# Patient Record
Sex: Female | Born: 1935 | Race: White | Hispanic: No | Smoking: Former smoker
Health system: Southern US, Community
[De-identification: ages and names within clinical notes are randomized; demographics above are authoritative.]

## PROBLEM LIST (undated history)

## (undated) DIAGNOSIS — M1990 Unspecified osteoarthritis, unspecified site: Secondary | ICD-10-CM

## (undated) DIAGNOSIS — I10 Essential (primary) hypertension: Secondary | ICD-10-CM

## (undated) HISTORY — PX: EYE SURGERY: SHX253

## (undated) HISTORY — PX: APPENDECTOMY: SHX54

## (undated) HISTORY — PX: WRIST ARTHROPLASTY: SHX1088

---

## 2013-12-01 ENCOUNTER — Other Ambulatory Visit: Payer: Self-pay | Admitting: Orthopedic Surgery

## 2013-12-05 ENCOUNTER — Encounter (HOSPITAL_COMMUNITY): Payer: Self-pay | Admitting: Pharmacy Technician

## 2013-12-09 ENCOUNTER — Other Ambulatory Visit (HOSPITAL_COMMUNITY): Payer: Self-pay | Admitting: *Deleted

## 2013-12-09 NOTE — Pre-Procedure Instructions (Signed)
Natasha Ford  12/09/2013   Your procedure is scheduled on:  Friday, December 19, 2013 at 7:30 AM.   [facility] Admitting Office at 5:30 AM.   Call this number if you have problems the morning of surgery: [phone]   Remember:   Do not eat food or drink liquids after midnight Thursday, 12/18/13.   Take these medicines the morning of surgery with A SIP OF WATER: None  Stop all Vitamins and Mobic (Meloxicam) as of Friday, 12/12/13. Do not take any Aspirin products or NSAIDS (Ibuprofen, Aleve, etc.) prior to surgery   Do not wear jewelry, make-up or nail polish.  Do not wear lotions, powders, or perfumes. You may wear deodorant.  Do not shave 48 hours prior to surgery.   Do not bring valuables to the hospital.  [facility] is not responsible                  for any belongings or valuables.               Contacts, dentures or bridgework may not be worn into surgery.  Leave suitcase in the car. After surgery it may be brought to your room.  For patients admitted to the hospital, discharge time is determined by your                treatment team.              Special Instructions: [place] - Preparing for Surgery  Before surgery, you can play an important role.  Because skin is not sterile, your skin needs to be as free of germs as possible.  You can reduce the number of germs on you skin by washing with CHG (chlorahexidine gluconate) soap before surgery.  CHG is an antiseptic cleaner which kills germs and bonds with the skin to continue killing germs even after washing.  Please DO NOT use if you have an allergy to CHG or antibacterial soaps.  If your skin becomes reddened/irritated stop using the CHG and inform your nurse when you arrive at Short Stay.  Do not shave (including legs and underarms) for at least 48 hours prior to the first CHG shower.  You may shave your face.  Please follow these instructions carefully:   1.  Shower with CHG Soap the  night before surgery and the                                morning of Surgery.  2.  If you choose to wash your hair, wash your hair first as usual with your       normal shampoo.  3.  After you shampoo, rinse your hair and body thoroughly to remove the                      Shampoo.  4.  Use CHG as you would any other liquid soap.  You can apply chg directly       to the skin and wash gently with scrungie or a clean washcloth.  5.  Apply the CHG Soap to your body ONLY FROM THE NECK DOWN.        Do not use on open wounds or open sores.  Avoid contact with your eyes, ears, mouth and genitals (private parts).  Wash genitals (private parts) with your normal soap.  6.  Wash thoroughly, paying  special attention to the area where your surgery        will be performed.  7.  Thoroughly rinse your body with warm water from the neck down.  8.  DO NOT shower/wash with your normal soap after using and rinsing off       the CHG Soap.  9.  Pat yourself dry with a clean towel.            10.  Wear clean pajamas.            11.  Place clean sheets on your bed the night of your first shower and do not        sleep with pets.  Day of Surgery  Do not apply any lotions the morning of surgery.  Please wear clean clothes to the hospital/surgery center.     Please read over the following fact sheets that you were given: Pain Booklet, Coughing and Deep Breathing, Blood Transfusion Information, MRSA Information and Surgical Site Infection Prevention

## 2013-12-10 ENCOUNTER — Encounter (HOSPITAL_COMMUNITY): Payer: Self-pay

## 2013-12-10 ENCOUNTER — Encounter (HOSPITAL_COMMUNITY)
Admission: RE | Admit: 2013-12-10 | Discharge: 2013-12-10 | Disposition: A | Discharge status: Home / Self Care | Payer: No Typology Code available for payment source | Source: Ambulatory Visit | Attending: Orthopedic Surgery | Admitting: Orthopedic Surgery

## 2013-12-10 DIAGNOSIS — Z01810 Encounter for preprocedural cardiovascular examination: Secondary | ICD-10-CM | POA: Insufficient documentation

## 2013-12-10 DIAGNOSIS — Z01812 Encounter for preprocedural laboratory examination: Secondary | ICD-10-CM | POA: Insufficient documentation

## 2013-12-10 HISTORY — DX: Essential (primary) hypertension: I10

## 2013-12-10 HISTORY — DX: Unspecified osteoarthritis, unspecified site: M19.90

## 2013-12-10 LAB — COMPREHENSIVE METABOLIC PANEL
ALT: 13 U/L (ref 0–35)
AST: 19 U/L (ref 0–37)
Albumin: 3.7 g/dL (ref 3.5–5.2)
Alkaline Phosphatase: 89 U/L (ref 39–117)
Anion gap: 15 (ref 5–15)
BUN: 17 mg/dL (ref 6–23)
CALCIUM: 9.7 mg/dL (ref 8.4–10.5)
CO2: 24 mEq/L (ref 19–32)
CREATININE: 0.67 mg/dL (ref 0.50–1.10)
Chloride: 103 mEq/L (ref 96–112)
GFR calc Af Amer: >90 mL/min (ref >90)
GFR calc non Af Amer: 79 mL/min — ABNORMAL LOW (ref >90)
GLUCOSE: 98 mg/dL (ref 70–99)
Potassium: 4.1 mEq/L (ref 3.7–5.3)
SODIUM: 142 meq/L (ref 137–147)
TOTAL PROTEIN: 7.2 g/dL (ref 6.0–8.3)
Total Bilirubin: 0.5 mg/dL (ref 0.3–1.2)

## 2013-12-10 LAB — URINALYSIS, ROUTINE W REFLEX MICROSCOPIC
BILIRUBIN URINE: NEGATIVE
Glucose, UA: NEGATIVE mg/dL
HGB URINE DIPSTICK: NEGATIVE
Ketones, ur: NEGATIVE mg/dL
Nitrite: NEGATIVE
PROTEIN: NEGATIVE mg/dL
Specific Gravity, Urine: 1.007 (ref 1.005–1.030)
Urobilinogen, UA: 0.2 mg/dL (ref 0.0–1.0)
pH: 7 (ref 5.0–8.0)

## 2013-12-10 LAB — CBC WITH DIFFERENTIAL/PLATELET
Basophils Absolute: 0 10*3/uL (ref 0.0–0.1)
Basophils Relative: 0 % (ref 0–1)
EOS ABS: 0.1 10*3/uL (ref 0.0–0.7)
EOS PCT: 1 % (ref 0–5)
HCT: 43 % (ref 36.0–46.0)
Hemoglobin: 14.2 g/dL (ref 12.0–15.0)
LYMPHS ABS: 1.5 10*3/uL (ref 0.7–4.0)
Lymphocytes Relative: 30 % (ref 12–46)
MCH: 31.3 pg (ref 26.0–34.0)
MCHC: 33 g/dL (ref 30.0–36.0)
MCV: 94.9 fL (ref 78.0–100.0)
Monocytes Absolute: 0.5 10*3/uL (ref 0.1–1.0)
Monocytes Relative: 9 % (ref 3–12)
Neutro Abs: 3 10*3/uL (ref 1.7–7.7)
Neutrophils Relative %: 60 % (ref 43–77)
Platelets: 257 10*3/uL (ref 150–400)
RBC: 4.53 MIL/uL (ref 3.87–5.11)
RDW: 15 % (ref 11.5–15.5)
WBC: 5 10*3/uL (ref 4.0–10.5)

## 2013-12-10 LAB — TYPE AND SCREEN
ABO/RH(D): O POS
Antibody Screen: NEGATIVE

## 2013-12-10 LAB — ABO/RH: ABO/RH(D): O POS

## 2013-12-10 LAB — PROTIME-INR
INR: 1.07 (ref 0.00–1.49)
PROTHROMBIN TIME: 13.9 s (ref 11.6–15.2)

## 2013-12-10 LAB — SURGICAL PCR SCREEN
MRSA, PCR: NEGATIVE
Staphylococcus aureus: NEGATIVE

## 2013-12-10 LAB — URINE MICROSCOPIC-ADD ON

## 2013-12-10 LAB — APTT: aPTT: 29 seconds (ref 24–37)

## 2013-12-10 NOTE — Pre-Procedure Instructions (Signed)
Natasha Ford  12/10/2013   Your procedure is scheduled on:  Friday, December 19, 2013 at 7:30 AM.   [facility] Entrance "A" Admitting Office at 5:30 AM.   Call this number if you have problems the morning of surgery: [phone]   Remember:   Do not eat food or drink liquids after midnight Thursday, 12/18/13.   Take these medicines the morning of surgery with A SIP OF WATER: None  Stop all Vitamins and Mobic (Meloxicam) as of Friday, 12/12/13. Do not take any Aspirin products or NSAIDS (Ibuprofen, Aleve, etc.) prior to surgery   Do not wear jewelry, make-up or nail polish.  Do not wear lotions, powders, or perfumes. You may wear deodorant.  Do not shave 48 hours prior to surgery.   Do not bring valuables to the hospital.  [facility] is not responsible                  for any belongings or valuables.               Contacts, dentures or bridgework may not be worn into surgery.  Leave suitcase in the car. After surgery it may be brought to your room.  For patients admitted to the hospital, discharge time is determined by your                treatment team.              Special Instructions: [name] - Preparing for Surgery  Before surgery, you can play an important role.  Because skin is not sterile, your skin needs to be as free of germs as possible.  You can reduce the number of germs on you skin by washing with CHG (chlorahexidine gluconate) soap before surgery.  CHG is an antiseptic cleaner which kills germs and bonds with the skin to continue killing germs even after washing.  Please DO NOT use if you have an allergy to CHG or antibacterial soaps.  If your skin becomes reddened/irritated stop using the CHG and inform your nurse when you arrive at Short Stay.  Do not shave (including legs and underarms) for at least 48 hours prior to the first CHG shower.  You may shave your face.  Please follow these instructions carefully:   1.  Shower with CHG Soap the  night before surgery and the                                morning of Surgery.  2.  If you choose to wash your hair, wash your hair first as usual with your       normal shampoo.  3.  After you shampoo, rinse your hair and body thoroughly to remove the                      Shampoo.  4.  Use CHG as you would any other liquid soap.  You can apply chg directly       to the skin and wash gently with scrungie or a clean washcloth.  5.  Apply the CHG Soap to your body ONLY FROM THE NECK DOWN.        Do not use on open wounds or open sores.  Avoid contact with your eyes, ears, mouth and genitals (private parts).  Wash genitals (private parts) with your normal soap.  6.  Wash thoroughly, paying  special attention to the area where your surgery        will be performed.  7.  Thoroughly rinse your body with warm water from the neck down.  8.  DO NOT shower/wash with your normal soap after using and rinsing off       the CHG Soap.  9.  Pat yourself dry with a clean towel.            10.  Wear clean pajamas.            11.  Place clean sheets on your bed the night of your first shower and do not        sleep with pets.  Day of Surgery  Do not apply any lotions the morning of surgery.  Please wear clean clothes to the hospital/surgery center.     Please read over the following fact sheets that you were given: Pain Booklet, Coughing and Deep Breathing, Blood Transfusion Information, MRSA Information and Surgical Site Infection Prevention

## 2013-12-18 MED ORDER — CHLORHEXIDINE GLUCONATE 4 % EX LIQD
60.0000 mL | Freq: Once | CUTANEOUS | Status: DC
  Filled 2013-12-18: qty 60

## 2013-12-18 MED ORDER — CEFAZOLIN SODIUM-DEXTROSE 2-3 GM-% IV SOLR
2.0000 g | INTRAVENOUS | Status: AC
  Administered 2013-12-19: 2 g via INTRAVENOUS
  Filled 2013-12-18: qty 50

## 2013-12-19 ENCOUNTER — Inpatient Hospital Stay (HOSPITAL_COMMUNITY): Payer: Medicare Other

## 2013-12-19 ENCOUNTER — Encounter (HOSPITAL_COMMUNITY): Payer: Self-pay | Admitting: Surgery

## 2013-12-19 ENCOUNTER — Inpatient Hospital Stay (HOSPITAL_COMMUNITY): Payer: Medicare Other | Admitting: Anesthesiology

## 2013-12-19 ENCOUNTER — Encounter (HOSPITAL_COMMUNITY)
Admission: RE | Disposition: A | Discharge status: Home Health | Payer: Self-pay | Source: Ambulatory Visit | Attending: Orthopedic Surgery

## 2013-12-19 ENCOUNTER — Encounter (HOSPITAL_COMMUNITY): Payer: Medicare Other | Admitting: Anesthesiology

## 2013-12-19 ENCOUNTER — Inpatient Hospital Stay (HOSPITAL_COMMUNITY)
Admission: RE | Admit: 2013-12-19 | Discharge: 2013-12-21 | DRG: 470 | Disposition: A | Discharge status: Home Health | Payer: Medicare Other | Source: Ambulatory Visit | Attending: Orthopedic Surgery | Admitting: Orthopedic Surgery

## 2013-12-19 DIAGNOSIS — M1610 Unilateral primary osteoarthritis, unspecified hip: Principal | ICD-10-CM | POA: Diagnosis present

## 2013-12-19 DIAGNOSIS — Z7982 Long term (current) use of aspirin: Secondary | ICD-10-CM

## 2013-12-19 DIAGNOSIS — Z79899 Other long term (current) drug therapy: Secondary | ICD-10-CM

## 2013-12-19 DIAGNOSIS — Z9089 Acquired absence of other organs: Secondary | ICD-10-CM

## 2013-12-19 DIAGNOSIS — Z87891 Personal history of nicotine dependence: Secondary | ICD-10-CM

## 2013-12-19 DIAGNOSIS — M1611 Unilateral primary osteoarthritis, right hip: Secondary | ICD-10-CM | POA: Diagnosis present

## 2013-12-19 DIAGNOSIS — M169 Osteoarthritis of hip, unspecified: Principal | ICD-10-CM | POA: Diagnosis present

## 2013-12-19 DIAGNOSIS — I10 Essential (primary) hypertension: Secondary | ICD-10-CM | POA: Diagnosis present

## 2013-12-19 HISTORY — PX: TOTAL HIP ARTHROPLASTY: SHX124

## 2013-12-19 SURGERY — ARTHROPLASTY, HIP, TOTAL, ANTERIOR APPROACH
Anesthesia: General | Site: Hip | Laterality: Right

## 2013-12-19 MED ORDER — NEOSTIGMINE METHYLSULFATE 10 MG/10ML IV SOLN
INTRAVENOUS | Status: AC
  Filled 2013-12-19: qty 1

## 2013-12-19 MED ORDER — ROCURONIUM BROMIDE 100 MG/10ML IV SOLN
INTRAVENOUS | Status: DC | PRN
  Administered 2013-12-19: 5 mg via INTRAVENOUS
  Administered 2013-12-19: 40 mg via INTRAVENOUS
  Administered 2013-12-19: 10 mg via INTRAVENOUS

## 2013-12-19 MED ORDER — KETOROLAC TROMETHAMINE 15 MG/ML IJ SOLN
7.5000 mg | Freq: Four times a day (QID) | INTRAMUSCULAR | Status: AC
  Administered 2013-12-19 – 2013-12-20 (×4): 7.5 mg via INTRAVENOUS

## 2013-12-19 MED ORDER — FENTANYL CITRATE 0.05 MG/ML IJ SOLN
INTRAMUSCULAR | Status: DC | PRN
  Administered 2013-12-19: 100 ug via INTRAVENOUS
  Administered 2013-12-19 (×2): 50 ug via INTRAVENOUS

## 2013-12-19 MED ORDER — ACETAMINOPHEN 325 MG PO TABS: 650.0000 mg | ORAL_TABLET | Freq: Four times a day (QID) | ORAL | Status: DC | PRN

## 2013-12-19 MED ORDER — DEXTROSE 5 % IV SOLN
INTRAVENOUS | Status: DC | PRN
  Administered 2013-12-19: 08:00:00 via INTRAVENOUS

## 2013-12-19 MED ORDER — ONDANSETRON HCL 4 MG/2ML IJ SOLN: 4.0000 mg | Freq: Four times a day (QID) | INTRAMUSCULAR | Status: DC | PRN

## 2013-12-19 MED ORDER — ONDANSETRON HCL 4 MG/2ML IJ SOLN
INTRAMUSCULAR | Status: DC | PRN
  Administered 2013-12-19: 4 mg via INTRAVENOUS

## 2013-12-19 MED ORDER — ALBUMIN HUMAN 5 % IV SOLN
INTRAVENOUS | Status: DC | PRN
  Administered 2013-12-19: 10:00:00 via INTRAVENOUS

## 2013-12-19 MED ORDER — BUPIVACAINE HCL 0.25 % IJ SOLN
INTRAMUSCULAR | Status: DC | PRN
  Administered 2013-12-19: 20 mL

## 2013-12-19 MED ORDER — MIDAZOLAM HCL 2 MG/2ML IJ SOLN
INTRAMUSCULAR | Status: AC
  Filled 2013-12-19: qty 2

## 2013-12-19 MED ORDER — METHOCARBAMOL 500 MG PO TABS
ORAL_TABLET | ORAL | Status: AC
  Filled 2013-12-19: qty 1

## 2013-12-19 MED ORDER — ARTIFICIAL TEARS OP OINT
TOPICAL_OINTMENT | OPHTHALMIC | Status: AC
  Filled 2013-12-19: qty 3.5

## 2013-12-19 MED ORDER — BUPIVACAINE HCL (PF) 0.25 % IJ SOLN
INTRAMUSCULAR | Status: AC
  Filled 2013-12-19: qty 30

## 2013-12-19 MED ORDER — PHENYLEPHRINE 40 MCG/ML (10ML) SYRINGE FOR IV PUSH (FOR BLOOD PRESSURE SUPPORT)
PREFILLED_SYRINGE | INTRAVENOUS | Status: AC
  Filled 2013-12-19: qty 10

## 2013-12-19 MED ORDER — HYDROCODONE-ACETAMINOPHEN 5-325 MG PO TABS
1.0000 | ORAL_TABLET | ORAL | Status: DC | PRN
  Administered 2013-12-19 – 2013-12-21 (×5): 1 via ORAL
  Filled 2013-12-19 (×6): qty 1

## 2013-12-19 MED ORDER — ACETAMINOPHEN 650 MG RE SUPP: 650.0000 mg | Freq: Four times a day (QID) | RECTAL | Status: DC | PRN

## 2013-12-19 MED ORDER — CEFAZOLIN SODIUM 1-5 GM-% IV SOLN
1.0000 g | Freq: Four times a day (QID) | INTRAVENOUS | Status: DC
  Filled 2013-12-19: qty 50

## 2013-12-19 MED ORDER — NEOSTIGMINE METHYLSULFATE 10 MG/10ML IV SOLN
INTRAVENOUS | Status: DC | PRN
  Administered 2013-12-19: 1 mg via INTRAVENOUS
  Administered 2013-12-19: 4 mg via INTRAVENOUS

## 2013-12-19 MED ORDER — HYDROMORPHONE HCL PF 1 MG/ML IJ SOLN: 0.5000 mg | INTRAMUSCULAR | Status: DC | PRN

## 2013-12-19 MED ORDER — GLYCOPYRROLATE 0.2 MG/ML IJ SOLN
INTRAMUSCULAR | Status: DC | PRN
  Administered 2013-12-19: 0.1 mg via INTRAVENOUS
  Administered 2013-12-19: 0.6 mg via INTRAVENOUS

## 2013-12-19 MED ORDER — ASPIRIN EC 325 MG PO TBEC
325.0000 mg | DELAYED_RELEASE_TABLET | Freq: Two times a day (BID) | ORAL | Status: DC
  Administered 2013-12-19 – 2013-12-21 (×4): 325 mg via ORAL
  Filled 2013-12-19 (×7): qty 1

## 2013-12-19 MED ORDER — PHENOL 1.4 % MT LIQD: 1.0000 | OROMUCOSAL | Status: DC | PRN

## 2013-12-19 MED ORDER — 0.9 % SODIUM CHLORIDE (POUR BTL) OPTIME
TOPICAL | Status: DC | PRN
  Administered 2013-12-19: 1000 mL

## 2013-12-19 MED ORDER — ONDANSETRON HCL 4 MG/2ML IJ SOLN
INTRAMUSCULAR | Status: AC
  Filled 2013-12-19: qty 2

## 2013-12-19 MED ORDER — BUPIVACAINE LIPOSOME 1.3 % IJ SUSP
INTRAMUSCULAR | Status: DC | PRN
  Administered 2013-12-19: 20 mL

## 2013-12-19 MED ORDER — PHENYLEPHRINE HCL 10 MG/ML IJ SOLN
INTRAMUSCULAR | Status: DC | PRN
  Administered 2013-12-19 (×2): 20 ug via INTRAVENOUS
  Administered 2013-12-19: 80 ug via INTRAVENOUS
  Administered 2013-12-19 (×3): 40 ug via INTRAVENOUS
  Administered 2013-12-19: 20 ug via INTRAVENOUS
  Administered 2013-12-19 (×3): 40 ug via INTRAVENOUS
  Administered 2013-12-19: 20 ug via INTRAVENOUS
  Administered 2013-12-19 (×2): 40 ug via INTRAVENOUS

## 2013-12-19 MED ORDER — MENTHOL 3 MG MT LOZG: 1.0000 | LOZENGE | OROMUCOSAL | Status: DC | PRN

## 2013-12-19 MED ORDER — ASPIRIN EC 325 MG PO TBEC: 325.0000 mg | DELAYED_RELEASE_TABLET | Freq: Two times a day (BID) | ORAL | Status: DC

## 2013-12-19 MED ORDER — LACTATED RINGERS IV SOLN
INTRAVENOUS | Status: DC | PRN
  Administered 2013-12-19 (×3): via INTRAVENOUS

## 2013-12-19 MED ORDER — GLYCOPYRROLATE 0.2 MG/ML IJ SOLN
INTRAMUSCULAR | Status: AC
  Filled 2013-12-19: qty 3

## 2013-12-19 MED ORDER — TRANEXAMIC ACID 100 MG/ML IV SOLN
1000.0000 mg | INTRAVENOUS | Status: AC
  Administered 2013-12-19: 1000 mg via INTRAVENOUS
  Filled 2013-12-19: qty 10

## 2013-12-19 MED ORDER — METHOCARBAMOL 500 MG PO TABS
500.0000 mg | ORAL_TABLET | Freq: Four times a day (QID) | ORAL | Status: DC | PRN
  Administered 2013-12-19 – 2013-12-21 (×3): 500 mg via ORAL
  Filled 2013-12-19 (×5): qty 1

## 2013-12-19 MED ORDER — LIDOCAINE HCL (CARDIAC) 20 MG/ML IV SOLN
INTRAVENOUS | Status: DC | PRN
  Administered 2013-12-19: 60 mg via INTRAVENOUS

## 2013-12-19 MED ORDER — ROCURONIUM BROMIDE 50 MG/5ML IV SOLN
INTRAVENOUS | Status: AC
  Filled 2013-12-19: qty 1

## 2013-12-19 MED ORDER — DOCUSATE SODIUM 100 MG PO CAPS
100.0000 mg | ORAL_CAPSULE | Freq: Two times a day (BID) | ORAL | Status: DC
  Filled 2013-12-19: qty 1

## 2013-12-19 MED ORDER — BISACODYL 5 MG PO TBEC: 5.0000 mg | DELAYED_RELEASE_TABLET | Freq: Every day | ORAL | Status: DC | PRN

## 2013-12-19 MED ORDER — HYDROCODONE-ACETAMINOPHEN 5-325 MG PO TABS: 1.0000 | ORAL_TABLET | Freq: Four times a day (QID) | ORAL | Status: DC | PRN

## 2013-12-19 MED ORDER — ONDANSETRON HCL 4 MG PO TABS: 4.0000 mg | ORAL_TABLET | Freq: Four times a day (QID) | ORAL | Status: DC | PRN

## 2013-12-19 MED ORDER — PROPOFOL 10 MG/ML IV BOLUS
INTRAVENOUS | Status: AC
  Filled 2013-12-19: qty 20

## 2013-12-19 MED ORDER — POLYETHYLENE GLYCOL 3350 17 G PO PACK: 17.0000 g | PACK | Freq: Every day | ORAL | Status: DC | PRN

## 2013-12-19 MED ORDER — DOCUSATE SODIUM 100 MG PO CAPS
100.0000 mg | ORAL_CAPSULE | Freq: Two times a day (BID) | ORAL | Status: DC
  Administered 2013-12-19 – 2013-12-21 (×5): 100 mg via ORAL
  Filled 2013-12-19 (×6): qty 1

## 2013-12-19 MED ORDER — KETOROLAC TROMETHAMINE 15 MG/ML IJ SOLN
7.5000 mg | Freq: Four times a day (QID) | INTRAMUSCULAR | Status: DC
  Filled 2013-12-19: qty 1

## 2013-12-19 MED ORDER — GLYCOPYRROLATE 0.2 MG/ML IJ SOLN
INTRAMUSCULAR | Status: AC
  Filled 2013-12-19: qty 1

## 2013-12-19 MED ORDER — ZOLPIDEM TARTRATE 5 MG PO TABS: 5.0000 mg | ORAL_TABLET | Freq: Every evening | ORAL | Status: DC | PRN

## 2013-12-19 MED ORDER — FENTANYL CITRATE 0.05 MG/ML IJ SOLN
INTRAMUSCULAR | Status: AC
  Filled 2013-12-19: qty 2

## 2013-12-19 MED ORDER — ALUM & MAG HYDROXIDE-SIMETH 200-200-20 MG/5ML PO SUSP: 30.0000 mL | ORAL | Status: DC | PRN

## 2013-12-19 MED ORDER — LISINOPRIL 10 MG PO TABS
10.0000 mg | ORAL_TABLET | Freq: Every day | ORAL | Status: DC
  Administered 2013-12-20 – 2013-12-21 (×2): 10 mg via ORAL
  Filled 2013-12-19 (×2): qty 1

## 2013-12-19 MED ORDER — FENTANYL CITRATE 0.05 MG/ML IJ SOLN
INTRAMUSCULAR | Status: AC
  Filled 2013-12-19: qty 5

## 2013-12-19 MED ORDER — DEXTROSE 5 % IV SOLN
500.0000 mg | Freq: Four times a day (QID) | INTRAVENOUS | Status: DC | PRN
  Filled 2013-12-19: qty 5

## 2013-12-19 MED ORDER — CEFAZOLIN SODIUM 1-5 GM-% IV SOLN
1.0000 g | Freq: Four times a day (QID) | INTRAVENOUS | Status: AC
  Administered 2013-12-19 (×2): 1 g via INTRAVENOUS
  Filled 2013-12-19: qty 50

## 2013-12-19 MED ORDER — ONDANSETRON HCL 4 MG/2ML IJ SOLN: 4.0000 mg | Freq: Once | INTRAMUSCULAR | Status: DC | PRN

## 2013-12-19 MED ORDER — PROPOFOL 10 MG/ML IV BOLUS
INTRAVENOUS | Status: DC | PRN
  Administered 2013-12-19: 130 mg via INTRAVENOUS

## 2013-12-19 MED ORDER — METHOCARBAMOL 500 MG PO TABS: 500.0000 mg | ORAL_TABLET | Freq: Two times a day (BID) | ORAL | Status: DC | PRN

## 2013-12-19 MED ORDER — SODIUM CHLORIDE 0.9 % IV SOLN
INTRAVENOUS | Status: DC
  Administered 2013-12-20: via INTRAVENOUS

## 2013-12-19 MED ORDER — FENTANYL CITRATE 0.05 MG/ML IJ SOLN
25.0000 ug | INTRAMUSCULAR | Status: DC | PRN
  Administered 2013-12-19 (×2): 50 ug via INTRAVENOUS

## 2013-12-19 MED ORDER — LIDOCAINE HCL (CARDIAC) 20 MG/ML IV SOLN
INTRAVENOUS | Status: AC
  Filled 2013-12-19: qty 5

## 2013-12-19 MED ORDER — BUPIVACAINE LIPOSOME 1.3 % IJ SUSP
20.0000 mL | INTRAMUSCULAR | Status: DC
Start: 2013-12-19 — End: 2013-12-19
  Filled 2013-12-19: qty 20

## 2013-12-19 SURGICAL SUPPLY — 51 items
BANDAGE GAUZE ELAST BULKY 4 IN (GAUZE/BANDAGES/DRESSINGS) IMPLANT
BENZOIN TINCTURE PRP APPL 2/3 (GAUZE/BANDAGES/DRESSINGS) ×3 IMPLANT
BLADE SAW SGTL 18X1.27X75 (BLADE) ×2 IMPLANT
BLADE SAW SGTL 18X1.27X75MM (BLADE) ×1
BLADE SURG ROTATE 9660 (MISCELLANEOUS) IMPLANT
BNDG COHESIVE 6X5 TAN STRL LF (GAUZE/BANDAGES/DRESSINGS) IMPLANT
CAPT HIP PF MOP ×3 IMPLANT
CELLS DAT CNTRL 66122 CELL SVR (MISCELLANEOUS) ×1 IMPLANT
CLOSURE STERI-STRIP 1/2X4 (GAUZE/BANDAGES/DRESSINGS)
CLSR STERI-STRIP ANTIMIC 1/2X4 (GAUZE/BANDAGES/DRESSINGS) IMPLANT
COVER SURGICAL LIGHT HANDLE (MISCELLANEOUS) ×3 IMPLANT
DRAPE C-ARM 42X72 X-RAY (DRAPES) ×3 IMPLANT
DRAPE STERI IOBAN 125X83 (DRAPES) ×3 IMPLANT
DRAPE U-SHAPE 47X51 STRL (DRAPES) ×9 IMPLANT
DRSG MEPILEX BORDER 4X8 (GAUZE/BANDAGES/DRESSINGS) ×3 IMPLANT
DURAPREP 26ML APPLICATOR (WOUND CARE) ×3 IMPLANT
ELECT BLADE 4.0 EZ CLEAN MEGAD (MISCELLANEOUS)
ELECT CAUTERY BLADE 6.4 (BLADE) ×3 IMPLANT
ELECT REM PT RETURN 9FT ADLT (ELECTROSURGICAL) ×3
ELECTRODE BLDE 4.0 EZ CLN MEGD (MISCELLANEOUS) IMPLANT
ELECTRODE REM PT RTRN 9FT ADLT (ELECTROSURGICAL) ×1 IMPLANT
GAUZE XEROFORM 1X8 LF (GAUZE/BANDAGES/DRESSINGS) ×3 IMPLANT
GLOVE BIOGEL PI IND STRL 8 (GLOVE) ×2 IMPLANT
GLOVE BIOGEL PI INDICATOR 8 (GLOVE) ×4
GLOVE ECLIPSE 7.5 STRL STRAW (GLOVE) ×6 IMPLANT
GOWN STRL REUS W/ TWL LRG LVL3 (GOWN DISPOSABLE) ×2 IMPLANT
GOWN STRL REUS W/ TWL XL LVL3 (GOWN DISPOSABLE) ×2 IMPLANT
GOWN STRL REUS W/TWL LRG LVL3 (GOWN DISPOSABLE) ×4
GOWN STRL REUS W/TWL XL LVL3 (GOWN DISPOSABLE) ×4
HOOD PEEL AWAY FACE SHEILD DIS (HOOD) ×6 IMPLANT
KIT BASIN OR (CUSTOM PROCEDURE TRAY) ×3 IMPLANT
KIT ROOM TURNOVER OR (KITS) ×3 IMPLANT
MANIFOLD NEPTUNE II (INSTRUMENTS) ×3 IMPLANT
NS IRRIG 1000ML POUR BTL (IV SOLUTION) ×3 IMPLANT
PACK TOTAL JOINT (CUSTOM PROCEDURE TRAY) ×3 IMPLANT
PAD ARMBOARD 7.5X6 YLW CONV (MISCELLANEOUS) ×6 IMPLANT
RTRCTR WOUND ALEXIS 18CM MED (MISCELLANEOUS) ×3
SPONGE LAP 18X18 X RAY DECT (DISPOSABLE) IMPLANT
STAPLER VISISTAT 35W (STAPLE) IMPLANT
SUT ETHIBOND NAB CT1 #1 30IN (SUTURE) ×6 IMPLANT
SUT MNCRL AB 3-0 PS2 18 (SUTURE) IMPLANT
SUT VIC AB 0 CT1 27 (SUTURE) ×2
SUT VIC AB 0 CT1 27XBRD ANBCTR (SUTURE) ×1 IMPLANT
SUT VIC AB 1 CT1 27 (SUTURE) ×8
SUT VIC AB 1 CT1 27XBRD ANBCTR (SUTURE) ×4 IMPLANT
SUT VIC AB 2-0 CT1 27 (SUTURE) ×2
SUT VIC AB 2-0 CT1 TAPERPNT 27 (SUTURE) ×1 IMPLANT
TOWEL OR 17X24 6PK STRL BLUE (TOWEL DISPOSABLE) ×3 IMPLANT
TOWEL OR 17X26 10 PK STRL BLUE (TOWEL DISPOSABLE) ×3 IMPLANT
TRAY FOLEY CATH 16FR SILVER (SET/KITS/TRAYS/PACK) IMPLANT
WATER STERILE IRR 1000ML POUR (IV SOLUTION) ×6 IMPLANT

## 2013-12-19 NOTE — Anesthesia Procedure Notes (Signed)
Procedure Name: Intubation Date/Time: 12/19/2013 7:46 AM Performed by: Marni GriffonJAMES, Horace Lukas B Pre-anesthesia Checklist: Emergency Drugs available, Patient identified, Suction available and Patient being monitored Patient Re-evaluated:Patient Re-evaluated prior to inductionOxygen Delivery Method: Circle system utilized Preoxygenation: Pre-oxygenation with 100% oxygen Intubation Type: IV induction Ventilation: Mask ventilation without difficulty and Oral airway inserted - appropriate to patient size Laryngoscope Size: Mac and 3 Grade View: Grade III Tube type: Oral Tube size: 7.0 mm Number of attempts: 1 Airway Equipment and Method: Stylet Placement Confirmation: breath sounds checked- equal and bilateral and positive ETCO2 (could see only base of cords) Secured at: 21 (cm at teeth) cm Dental Injury: Teeth and Oropharynx as per pre-operative assessment  Comments: Pt oropharynx long and slightly narrow.  Teeth long.

## 2013-12-19 NOTE — Progress Notes (Signed)
Orthopedic Tech Progress Note Patient Details:  Natasha Junkeratricia Scotto 11/29/1928 [id number]  Patient ID: Natasha JunkerPatricia Ford, female   DOB: 07/30/1928, 80 y.o.   MRN: [id number] Family has stated that pt does not need trapeze bar patient helper; rn notified  Nikki DomCrawford, Braun Rocca 12/19/2013, 2:13 PM

## 2013-12-19 NOTE — Brief Op Note (Signed)
12/19/2013  10:11 AM  PATIENT:  Natasha JunkerPatricia Ford  80 y.o. female  PRE-OPERATIVE DIAGNOSIS:  SEVERE DEGENERATIVE JOINT DISEASE  POST-OPERATIVE DIAGNOSIS:  SEVERE DEGENERATIVE JOINT DISEASE  PROCEDURE:  Procedure(s): RIGHT TOTAL HIP ARTHROPLASTY ANTERIOR APPROACH (Right)  SURGEON:  Surgeon(s) and Role:    * Harvie JuniorJohn L Andrewjames Weirauch, MD - Primary  PHYSICIAN ASSISTANT:   ASSISTANTS: bethune   ANESTHESIA:   general  EBL:  Total I/O In: 2300 [I.V.:2050; IV Piggyback:250] Out: 400 [Blood:400]  BLOOD ADMINISTERED:none  DRAINS: none   LOCAL MEDICATIONS USED:  MARCAINE    and OTHER experel  SPECIMEN:  No Specimen  DISPOSITION OF SPECIMEN:  N/A  COUNTS:  YES  TOURNIQUET:  * No tourniquets in log *  DICTATION: .Other Dictation: Dictation Number [id number]  PLAN OF CARE: Admit to inpatient   PATIENT DISPOSITION:  PACU - hemodynamically stable.   Delay start of Pharmacological VTE agent (>24hrs) due to surgical blood loss or risk of bleeding: no

## 2013-12-19 NOTE — Anesthesia Preprocedure Evaluation (Signed)
Anesthesia Evaluation  Patient identified by MRN, date of birth, ID band Patient awake    Reviewed: Allergy & Precautions, H&P , NPO status , Patient's Chart, lab work & pertinent test results  Airway Mallampati: II  Neck ROM: full    Dental   Pulmonary former smoker,          Cardiovascular hypertension,     Neuro/Psych    GI/Hepatic   Endo/Other    Renal/GU      Musculoskeletal  (+) Arthritis -,   Abdominal   Peds  Hematology   Anesthesia Other Findings   Reproductive/Obstetrics                           Anesthesia Physical Anesthesia Plan  ASA: II  Anesthesia Plan: General   Post-op Pain Management:    Induction: Intravenous  Airway Management Planned: Oral ETT  Additional Equipment:   Intra-op Plan:   Post-operative Plan: Extubation in OR  Informed Consent: I have reviewed the patients History and Physical, chart, labs and discussed the procedure including the risks, benefits and alternatives for the proposed anesthesia with the patient or authorized representative who has indicated his/her understanding and acceptance.     Plan Discussed with: CRNA, Anesthesiologist and Surgeon  Anesthesia Plan Comments:         Anesthesia Quick Evaluation

## 2013-12-19 NOTE — Evaluation (Signed)
Physical Therapy Evaluation Patient Details Name: Natasha Ford MRN: [id number] DOB: July 01, 1928 Today's Date: 12/19/2013   History of Present Illness  Adm 12/19/13 for Rt direct anterior approach THA. PMHx- Lt wrist ORIF, HTN, OA  Clinical Impression  Pt is s/p THA resulting in the deficits listed below (see PT Problem List). Pt mobilizing very well with limited pain at this time. Noted plan for return to Independent Living on Sunday 7/12 with HHPT. Pt and family have arranged for 24/7 care and anticipate pt will be appropriate for d/c on 7/12. Pt will benefit from skilled PT to increase their independence and safety with mobility to allow discharge to the venue listed below.        Follow Up Recommendations Home health PT;Supervision/Assistance - 24 hour    Equipment Recommendations  Rolling walker with 5" wheels (youth RW--pt hopes to only need a cane) Will continue to assess   Recommendations for Other Services OT consult     Precautions / Restrictions Precautions Precautions: None Restrictions Weight Bearing Restrictions: Yes RLE Weight Bearing: Weight bearing as tolerated      Mobility  Bed Mobility Overal bed mobility: Needs Assistance Bed Mobility: Supine to Sit     Supine to sit: Supervision     General bed mobility comments: HOB 0; vc for sequencing to decr pain, supervision for safety due to recent anesthesia  Transfers Overall transfer level: Needs assistance Equipment used: Rolling walker (2 wheeled);1 person hand held assist Transfers: Sit to/from [employer] Sit to Stand: Min guard Stand pivot transfers: Min assist       General transfer comment: pivot to [facility] without a device, using armrest for support; educated on proper use of RW  Ambulation/Gait Ambulation/Gait assistance: Min guard Ambulation Distance (Feet): 10 Feet Assistive device: Rolling walker (2 wheeled) Gait Pattern/deviations: Step-through pattern;Decreased stance time  - right;Decreased step length - right;Antalgic     General Gait Details: RW slightly too tall (pt 4'10") and pt barely putting any pressure on the RW; however used for safety due to slight "foggy" feeling post surgery  Stairs            Wheelchair Mobility    Modified Rankin (Stroke Patients Only)       Balance                                             Pertinent Vitals/Pain "this feels great. Better than before surgery" pt pre-medicated for pain SaO2 99% on 2L [place] O2; remained 98-99% on RA throughout session; RN informed and agreed OK to leave O2 off while up in chair    Home Living Family/patient expects to be discharged to:: Private residence Living Arrangements: Spouse/significant other;Children Available Help at Discharge: Family;Available 24 hours/day Type of Home: Independent living facility Home Access: Level entry     Home Layout: One level Home Equipment: None (borrowed w/c over last 2 weeks due to severe pain) Additional Comments: Spouse has dementia; pt has 8 children and amongst them they have arranged for 24/7 care of pt and spouse     Prior Function Level of Independence: Independent         Comments: had sudden decline in function in last 2 weeks prior to surgery due to severe incr in hip pain; previously independent and attending exercise classes      Hand Dominance  Extremity/Trunk Assessment   Upper Extremity Assessment: Overall WFL for tasks assessed           Lower Extremity Assessment: RLE deficits/detail RLE Deficits / Details: AAROM hip WFL, knee and ankle AROM WFL; hip strength 2+ to 3-/5 due to pain/recent surgery; knee and ankle 5/5    Cervical / Trunk Assessment: Normal  Communication   Communication: No difficulties  Cognition Arousal/Alertness: Awake/alert Behavior During Therapy: WFL for tasks assessed/performed Overall Cognitive Status: Within Functional Limits for tasks assessed                       General Comments General comments (skin integrity, edema, etc.): Daughter Banker(RN) arrived mid-session.    Exercises Total Joint Exercises Ankle Circles/Pumps: AROM;Both;10 reps Quad Sets: AROM;Both;5 reps Heel Slides: AAROM;Right (3 reps) Hip ABduction/ADduction: AAROM;Right (3 reps)      Assessment/Plan    PT Assessment Patient needs continued PT services  PT Diagnosis Difficulty walking;Acute pain   PT Problem List Decreased strength;Decreased range of motion;Decreased activity tolerance;Decreased balance;Decreased mobility;Decreased knowledge of use of DME;Pain  PT Treatment Interventions DME instruction;Gait training;Functional mobility training;Therapeutic activities;Therapeutic exercise;Patient/family education   PT Goals (Current goals can be found in the Care Plan section) Acute Rehab PT Goals Patient Stated Goal: [facility] PT Goal Formulation: With patient Time For Goal Achievement: 12/21/13 Potential to Achieve Goals: Good    Frequency 7X/week   Barriers to discharge        Co-evaluation               End of Session Equipment Utilized During Treatment: Gait belt Activity Tolerance: Patient tolerated treatment well Patient left: in chair;with call bell/phone within reach;with family/visitor present Nurse Communication: Mobility status;Other (comment) (pt on RA with SaO2 99% and up in chair)         Time: [id number] PT Time Calculation (min): 29 min   Charges:   PT Evaluation $Initial PT Evaluation Tier I: 1 Procedure PT Treatments $Therapeutic Activity: 8-22 mins   PT G Codes:          Govind Furey 12/19/2013, 4:44 PM Pager [phone]

## 2013-12-19 NOTE — Op Note (Signed)
NAME:  Natasha Ford, Natasha Ford NO.:  [id number]  MEDICAL RECORD NO.:  [id number]  LOCATION:  [facility]                         FACILITY:  [facility]  PHYSICIAN:  Harvie Junior, M.D.   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  PROCEDURE:  Right total hip replacement with a Corail stem size 12, 48 mm pinnacle, porous-coated cup, a +4 neutral liner, 32 mm +1 hip ball.  SURGEON:  Harvie Junior, MD  ASSISTANT:  Marshia Ly, PA  ANESTHESIA:  General.  BRIEF HISTORY:  Ms. Feltes is an 80 year old female with history of significant complaints of right hip pain.  X-ray showed severe collapse of the femoral head and after failure of all conservative care, she was taken to the operating room for right total hip replacement.  Because of her small size and need to be independent, we felt that anterior approach was appropriate and so she was brought to the operating room for this procedure.  DESCRIPTION OF PROCEDURE:  The patient was taken to the operating room. After adequate anesthesia was obtained with general anesthetic, the patient was placed supine on the operating table.  She was then moved onto the Hana bed after feet were placed into the foot positions.  Once this was done, preoperative fluoro images were taken showing the severe collapse of the femoral head as well as the pelvis alignment and at this point, the hip was prepped and draped in usual sterile fashion. Following this, an incision was made from just inferior and lateral to the anterior superior iliac spine, diagonal towards the femur, subcutaneous tissue down to the level of the tensor fascia and  the fascia over the tensor fascia is retracted and the tensor fascia was retracted laterally.  The interval between is exploited and retractors were put in place.  The capsule was opened.  After the vessels were coagulated in the interval between the vastus lateralis and the tensor fascia.  The capsules opened and tagged.  Provisional neck cut was made under fluoroscopic guidance.  The hip was externally rotated, and slight traction is put on place, retractors were put in place.  The acetabulum was identified, sequentially reamed to a level 47 and a 48 mm pinnacle porous-coated cup was used, hammered into place.  The hole eliminator was placed and a +4 neutral liner was placed.  Following this, attention was turned to the femur.  It is a hook from the bed is put in place. The hip was then externally rotated, and taken down over position.  The arm was put in place at this point, and the introductory box osteotome issues with a curette to get lateral and the femur is then sequentially rasped or broached up to a level of 12.  Once a 12 broach is put in place, the trial reduction is undertaken.  Excellent leg length, stability was achieved.  At this point, the trial broach was removed.  The final 12 Corail stem was placed, a +0 ball was placed.  Reduction is undertaken and the hip put in 45 degrees of extension down to 60 of extension external rotation to 60.  No tendency towards subluxation dislocation at this point.  At this point, the hip was held on neutral rotation.  Final fluoroscopic images were taken.  Leg lengths are symmetric.  The hip capsule was closed with 1 Vicryl running, the tensor fascia was closed with 1 Vicryl running, 20 mL of 0.25% Marcaine and 20 mL Exparel are instilled around the hip for postoperative anesthesia, and the skin was then closed with 2-0 Vicryl and 3-0 Monocryl subcuticular.  Benzoin and Steri-Strips were applied.  Sterile compressive dressing was applied,  and the patient was taken to the recovery and she was noted to be in satisfactory condition.  Estimated blood loss for procedure is 400 mL.     Harvie JuniorJohn L. Jimi Giza, M.D.     Ranae PlumberJLG/MEDQ  D:  12/19/2013  T:  12/19/2013  Job:  [id number]

## 2013-12-19 NOTE — Anesthesia Postprocedure Evaluation (Signed)
Anesthesia Post Note  Patient: Natasha Ford  Procedure(s) Performed: Procedure(s) (LRB): RIGHT TOTAL HIP ARTHROPLASTY ANTERIOR APPROACH (Right)  Anesthesia type: General  Patient location: PACU  Post pain: Pain level controlled and Adequate analgesia  Post assessment: Post-op Vital signs reviewed, Patient's Cardiovascular Status Stable, Respiratory Function Stable, Patent Airway and Pain level controlled  Last Vitals:  Filed Vitals:   12/19/13 1115  BP: 149/55  Pulse: 71  Temp:   Resp: 13    Post vital signs: Reviewed and stable  Level of consciousness: awake, alert  and oriented  Complications: No apparent anesthesia complications

## 2013-12-19 NOTE — Discharge Instructions (Signed)
Ambulate WBAT on right NO hip precautions. Do not change hip dressing. It will be changed at office in 2 weeks. Keep incision dry.

## 2013-12-19 NOTE — Care Management Note (Signed)
CARE MANAGEMENT NOTE 12/19/2013  Patient:  Hazeline JunkerCHISHOLM,Corbyn   Account Number:  [id number]  Date Initiated:  12/19/2013  Documentation initiated by:  Vance PeperBRADY,Adea Geisel  Subjective/Objective Assessment:   80 yr old female s/p right total hip arthroplasty.     Action/Plan:   PT/OT eval  Patient preoperatively setup with [facility].  Case Manager will follow   Anticipated DC Date:     Anticipated DC Plan:  HOME W HOME HEALTH SERVICES         Choice offered to / List presented to:             [facility]  [facility]   Status of service:  In process, will continue to follow Medicare Important Message given?   (If response is "NO", the following Medicare IM given date fields will be blank) Date Medicare IM given:   Medicare IM given by:   Date Additional Medicare IM given:   Additional Medicare IM given by:    Discharge Disposition:    Per UR Regulation:  Reviewed for med. necessity/level of care/duration of stay

## 2013-12-19 NOTE — Transfer of Care (Signed)
Immediate Anesthesia Transfer of Care Note  Patient: Natasha Ford  Procedure(s) Performed: Procedure(s): RIGHT TOTAL HIP ARTHROPLASTY ANTERIOR APPROACH (Right)  Patient Location: PACU  Anesthesia Type:General  Level of Consciousness: awake and patient cooperative  Airway & Oxygen Therapy: Patient Spontanous Breathing and Patient connected to nasal cannula oxygen  Post-op Assessment: Report given to PACU RN, Post -op Vital signs reviewed and stable and Patient moving all extremities  Post vital signs: Reviewed and stable  Complications: No apparent anesthesia complications

## 2013-12-19 NOTE — Progress Notes (Signed)
Utilization review completed.  

## 2013-12-19 NOTE — H&P (Signed)
TOTAL HIP ADMISSION H&P  Patient is admitted for right total hip arthroplasty.  Subjective:  Chief Complaint: right hip pain  HPI: Natasha Ford, 80 y.o. female, has a history of pain and functional disability in the right hip(s) due to arthritis and patient has failed non-surgical conservative treatments for greater than 12 weeks to include NSAID's and/or analgesics, supervised PT with diminished ADL's post treatment, weight reduction as appropriate and activity modification.  Onset of symptoms was gradual starting 3 years ago with gradually worsening course since that time.The patient noted no past surgery on the right hip(s).  Patient currently rates pain in the right hip at 9 out of 10 with activity. Patient has night pain, worsening of pain with activity and weight bearing, trendelenberg gait, pain that interfers with activities of daily living, pain with passive range of motion, crepitus and joint swelling. Patient has evidence of subchondral cysts, subchondral sclerosis, periarticular osteophytes, joint subluxation and joint space narrowing by imaging studies. This condition presents safety issues increasing the risk of falls. This patient has had falilure of hemi-arthroplasty and failure of conservative care.  There is no current active infection.  There are no active problems to display for this patient.  Past Medical History  Diagnosis Date  . Hypertension   . Arthritis     Past Surgical History  Procedure Laterality Date  . Eye surgery    . Appendectomy    . Wrist arthroplasty      has plate    Prescriptions prior to admission  Medication Sig Dispense Refill  . Calcium-Vitamin D (CALTRATE 600 PLUS-VIT D PO) Take 1 tablet by mouth 2 (two) times daily.      . cholecalciferol (VITAMIN D) 1000 UNITS tablet Take 1,000 Units by mouth 2 (two) times daily.      Marland Kitchen lisinopril (PRINIVIL,ZESTRIL) 10 MG tablet Take 10 mg by mouth daily at 12 noon.      . meloxicam (MOBIC) 7.5 MG tablet  Take 7.5 mg by mouth 2 (two) times daily as needed for pain.      . Multiple Vitamins-Minerals (PRESERVISION AREDS) TABS Take 1 tablet by mouth 2 (two) times daily.      . Tetrahydrozoline HCl (VISINE OP) Place 1 drop into both eyes at bedtime.       Allergies  Allergen Reactions  . Sulfa Antibiotics Hives and Itching    History  Substance Use Topics  . Smoking status: Former Research scientist (life sciences)  . Smokeless tobacco: Not on file  . Alcohol Use: Yes     Comment: daily    History reviewed. No pertinent family history.   ROS ROS: I have reviewed the patient's review of systems thoroughly and there are no positive responses as relates to the HPI. Objective:  Physical Exam  Vital signs in last 24 hours: Temp:  [97.7 F (36.5 C)] 97.7 F (36.5 C) (07/10 0619) Pulse Rate:  [72] 72 (07/10 0619) Resp:  [18] 18 (07/10 0619) BP: (167)/(65) 167/65 mmHg (07/10 0619) SpO2:  [98 %] 98 % (07/10 0619) Well-developed well-nourished patient in no acute distress. Alert and oriented x3 HEENT:within normal limits Cardiac: Regular rate and rhythm Pulmonary: Lungs clear to auscultation Abdomen: Soft and nontender.  Normal active bowel sounds  Musculoskeletal: (R hip: painful rom limited rom  Labs: Recent Results (from the past 2160 hour(s))  SURGICAL PCR SCREEN     Status: None   Collection Time    12/10/13  9:30 AM      Result Value Ref Range  MRSA, PCR NEGATIVE  NEGATIVE   Staphylococcus aureus NEGATIVE  NEGATIVE   Comment:            The Xpert SA Assay (FDA     approved for NASAL specimens     in patients over 19 years of age),     is one component of     a comprehensive surveillance     program.  Test performance has     been validated by [employer] for patients greater     than or equal to 17 year old.     It is not intended     to diagnose infection nor to     guide or monitor treatment.  APTT     Status: None   Collection Time    12/10/13  9:31 AM      Result Value Ref Range    aPTT 29  24 - 37 seconds  CBC WITH DIFFERENTIAL     Status: None   Collection Time    12/10/13  9:31 AM      Result Value Ref Range   WBC 5.0  4.0 - 10.5 K/uL   RBC 4.53  3.87 - 5.11 MIL/uL   Hemoglobin 14.2  12.0 - 15.0 g/dL   HCT 43.0  36.0 - 46.0 %   MCV 94.9  78.0 - 100.0 fL   MCH 31.3  26.0 - 34.0 pg   MCHC 33.0  30.0 - 36.0 g/dL   RDW 15.0  11.5 - 15.5 %   Platelets 257  150 - 400 K/uL   Neutrophils Relative % 60  43 - 77 %   Neutro Abs 3.0  1.7 - 7.7 K/uL   Lymphocytes Relative 30  12 - 46 %   Lymphs Abs 1.5  0.7 - 4.0 K/uL   Monocytes Relative 9  3 - 12 %   Monocytes Absolute 0.5  0.1 - 1.0 K/uL   Eosinophils Relative 1  0 - 5 %   Eosinophils Absolute 0.1  0.0 - 0.7 K/uL   Basophils Relative 0  0 - 1 %   Basophils Absolute 0.0  0.0 - 0.1 K/uL  COMPREHENSIVE METABOLIC PANEL     Status: Abnormal   Collection Time    12/10/13  9:31 AM      Result Value Ref Range   Sodium 142  137 - 147 mEq/L   Potassium 4.1  3.7 - 5.3 mEq/L   Chloride 103  96 - 112 mEq/L   CO2 24  19 - 32 mEq/L   Glucose, Bld 98  70 - 99 mg/dL   BUN 17  6 - 23 mg/dL   Creatinine, Ser 0.67  0.50 - 1.10 mg/dL   Calcium 9.7  8.4 - 10.5 mg/dL   Total Protein 7.2  6.0 - 8.3 g/dL   Albumin 3.7  3.5 - 5.2 g/dL   AST 19  0 - 37 U/L   ALT 13  0 - 35 U/L   Alkaline Phosphatase 89  39 - 117 U/L   Total Bilirubin 0.5  0.3 - 1.2 mg/dL   GFR calc non Af Amer 79 (*) >90 mL/min   GFR calc Af Amer >90  >90 mL/min   Comment: (NOTE)     The eGFR has been calculated using the CKD EPI equation.     This calculation has not been validated in all clinical situations.     eGFR's persistently <90 mL/min signify possible  Chronic Kidney     Disease.   Anion gap 15  5 - 15  PROTIME-INR     Status: None   Collection Time    12/10/13  9:31 AM      Result Value Ref Range   Prothrombin Time 13.9  11.6 - 15.2 seconds   INR 1.07  0.00 - 1.49  URINALYSIS, ROUTINE W REFLEX MICROSCOPIC     Status: Abnormal   Collection Time     12/10/13  9:31 AM      Result Value Ref Range   Color, Urine YELLOW  YELLOW   APPearance CLEAR  CLEAR   Specific Gravity, Urine 1.007  1.005 - 1.030   pH 7.0  5.0 - 8.0   Glucose, UA NEGATIVE  NEGATIVE mg/dL   Hgb urine dipstick NEGATIVE  NEGATIVE   Bilirubin Urine NEGATIVE  NEGATIVE   Ketones, ur NEGATIVE  NEGATIVE mg/dL   Protein, ur NEGATIVE  NEGATIVE mg/dL   Urobilinogen, UA 0.2  0.0 - 1.0 mg/dL   Nitrite NEGATIVE  NEGATIVE   Leukocytes, UA TRACE (*) NEGATIVE  URINE MICROSCOPIC-ADD ON     Status: None   Collection Time    12/10/13  9:31 AM      Result Value Ref Range   Squamous Epithelial / LPF RARE  RARE   WBC, UA 0-2  <3 WBC/hpf  TYPE AND SCREEN     Status: None   Collection Time    12/10/13  9:38 AM      Result Value Ref Range   ABO/RH(D) O POS     Antibody Screen NEG     Sample Expiration 12/24/2013    ABO/RH     Status: None   Collection Time    12/10/13  9:38 AM      Result Value Ref Range   ABO/RH(D) O POS      There is no weight on file to calculate BMI.   Imaging Review Plain radiographs demonstrate severe degenerative joint disease of the right hip(s). The bone quality appears to be good for age and reported activity level.  Assessment/Plan:  End stage arthritis, right hip(s)  The patient history, physical examination, clinical judgement of the provider and imaging studies are consistent with end stage degenerative joint disease of the right hip(s) and total hip arthroplasty is deemed medically necessary. The treatment options including medical management, injection therapy, arthroscopy and arthroplasty were discussed at length. The risks and benefits of total hip arthroplasty were presented and reviewed. The risks due to aseptic loosening, infection, stiffness, dislocation/subluxation,  thromboembolic complications and other imponderables were discussed.  The patient acknowledged the explanation, agreed to proceed with the plan and consent was signed.  Patient is being admitted for inpatient treatment for surgery, pain control, PT, OT, prophylactic antibiotics, VTE prophylaxis, progressive ambulation and ADL's and discharge planning.The patient is planning to be discharged home with home health services

## 2013-12-20 LAB — CBC
HEMATOCRIT: 27.7 % — AB (ref 36.0–46.0)
HEMOGLOBIN: 9.3 g/dL — AB (ref 12.0–15.0)
MCH: 31.1 pg (ref 26.0–34.0)
MCHC: 33.6 g/dL (ref 30.0–36.0)
MCV: 92.6 fL (ref 78.0–100.0)
Platelets: 194 10*3/uL (ref 150–400)
RBC: 2.99 MIL/uL — ABNORMAL LOW (ref 3.87–5.11)
RDW: 14.7 % (ref 11.5–15.5)
WBC: 7.5 10*3/uL (ref 4.0–10.5)

## 2013-12-20 LAB — BASIC METABOLIC PANEL
Anion gap: 13 (ref 5–15)
BUN: 9 mg/dL (ref 6–23)
CALCIUM: 8.2 mg/dL — AB (ref 8.4–10.5)
CO2: 24 mEq/L (ref 19–32)
CREATININE: 0.6 mg/dL (ref 0.50–1.10)
Chloride: 104 mEq/L (ref 96–112)
GFR calc Af Amer: >90 mL/min (ref >90)
GFR, EST NON AFRICAN AMERICAN: 82 mL/min — AB (ref >90)
GLUCOSE: 113 mg/dL — AB (ref 70–99)
Potassium: 3.7 mEq/L (ref 3.7–5.3)
Sodium: 141 mEq/L (ref 137–147)

## 2013-12-20 NOTE — Progress Notes (Signed)
Physical Therapy Treatment Patient Details Name: Hazeline Junkeratricia Hansson MRN: [id number] DOB: 09/07/1928 Today's Date: 12/20/2013    History of Present Illness Adm 12/19/13 for Rt direct anterior approach THA. PMHx- Lt wrist ORIF, HTN, OA    PT Comments    Mrs. Baltazar ApoChisholm continues to progress very well towards physical therapy goals, as she was able to ambulate up to 200 feet this afternoon at a supervision level for safety while using a rolling walker. She reports compliance with therapeutic exercises provided in a handout in previous therapy session and continues to be motivated to improve functional independence. Will follow up tomorrow for further gait and mobility training, however feel she is adequate for d/c when medically ready, from a PT standpoin. Patient will continue to benefit from skilled physical therapy services at home in order to further improve independence with functional mobility.   Follow Up Recommendations  Home health PT;Supervision/Assistance - 24 hour     Equipment Recommendations  Rolling walker with 5" wheels (youth RW--pt hopes to only need a cane)    Recommendations for Other Services OT consult     Precautions / Restrictions Restrictions Weight Bearing Restrictions: Yes RLE Weight Bearing: Weight bearing as tolerated    Mobility  Bed Mobility Overal bed mobility: Modified Independent             General bed mobility comments: Mod I with all bed mobility. Educated to use LLE to support RLE into bed. No physical assist needed.  Transfers Overall transfer level: Needs assistance Equipment used: Rolling walker (2 wheeled) Transfers: Sit to/from Stand Sit to Stand: Supervision         General transfer comment: Supervision for safety from lowest bed setting. VCs for hand placement. Demonstrates good stability.  Ambulation/Gait Ambulation/Gait assistance: Supervision Ambulation Distance (Feet): 200 Feet Assistive device: Rolling walker (2  wheeled) Gait Pattern/deviations: Step-through pattern;Decreased stride length     General Gait Details: Improved control of RW with turns versus previous PT session. Intermittent cues for RW placement within base of support. Continues to move slowly and cautiously but with adequate balance.   Stairs            Wheelchair Mobility    Modified Rankin (Stroke Patients Only)       Balance                                    Cognition Arousal/Alertness: Awake/alert Behavior During Therapy: WFL for tasks assessed/performed Overall Cognitive Status: Within Functional Limits for tasks assessed                      Exercises      General Comments        Pertinent Vitals/Pain 1/10 pain Patient repositioned in bed for comfort.     Home Living                      Prior Function            PT Goals (current goals can now be found in the care plan section) Acute Rehab PT Goals PT Goal Formulation: With patient Time For Goal Achievement: 12/21/13 Potential to Achieve Goals: Good Progress towards PT goals: Progressing toward goals    Frequency  7X/week    PT Plan Current plan remains appropriate    Co-evaluation             End  of Session   Activity Tolerance: Patient tolerated treatment well Patient left: with call bell/phone within reach;in bed     Time: 8119-1478 PT Time Calculation (min): 16 min  Charges:  $Gait Training: 8-22 mins                    G Codes:      Charlsie Merles, [place] [phone]  Berton Mount 12/20/2013, 4:54 PM

## 2013-12-20 NOTE — Evaluation (Signed)
Occupational Therapy Evaluation Patient Details Name: Natasha Ford MRN: [id number] DOB: 09-Dec-1928 Today's Date: 12/20/2013    History of Present Illness Adm 12/19/13 for Rt direct anterior approach THA. PMHx- Lt wrist ORIF, HTN, OA   Clinical Impression   Pt was independent at baseline. She is doing remarkably well POD 1.  Pt and family educated in ADL using AE and DME.  Pt requires supervision for ADL transfers with RW and verbal cues for hand placement and min assist for LB bathing and dressing.  Pt will have 24 hour assist of her family upon discharge. No further OT needs.    Follow Up Recommendations  No OT follow up;Supervision/Assistance - 24 hour    Equipment Recommendations  None recommended by OT    Recommendations for Other Services       Precautions / Restrictions Precautions Precautions: Fall Restrictions Weight Bearing Restrictions: Yes RLE Weight Bearing: Weight bearing as tolerated      Mobility Bed Mobility Overal bed mobility: Modified Independent Bed Mobility: Supine to Sit;Sit to Supine     Supine to sit: Modified independent (Device/Increase time) Sit to supine: Modified independent (Device/Increase time)   General bed mobility comments: slow, but no physical assist required  Transfers Overall transfer level: Needs assistance Equipment used: Rolling walker (2 wheeled) Transfers: Sit to/from Stand Sit to Stand: Supervision         General transfer comment: verbal cues for hand placement    Balance                                            ADL Overall ADL's : Needs assistance/impaired Eating/Feeding: Independent;Sitting   Grooming: Wash/dry hands;Supervision/safety;Standing   Upper Body Bathing: Supervision/ safety;Sitting   Lower Body Bathing: Minimal assistance;Sit to/from stand   Upper Body Dressing : Set up;Sitting   Lower Body Dressing: Minimal assistance;Sit to/from stand   Toilet Transfer:  Supervision/safety;Comfort height toilet;Grab bars   Toileting- Clothing Manipulation and Hygiene: Supervision/safety;Sitting/lateral lean       Functional mobility during ADLs: Supervision/safety;Rolling walker;Cueing for sequencing General ADL Comments: Educated pt in use of AE for LB ADL and requested she attempt to perform herself daily.  She will rely on her family until she is able to perform independently.     Vision                     Perception     Praxis      Pertinent Vitals/Pain Soreness, R hip, iced     Hand Dominance Right   Extremity/Trunk Assessment Upper Extremity Assessment Upper Extremity Assessment: Overall WFL for tasks assessed   Lower Extremity Assessment Lower Extremity Assessment: Defer to PT evaluation   Cervical / Trunk Assessment Cervical / Trunk Assessment: Normal   Communication Communication Communication: No difficulties   Cognition Arousal/Alertness: Awake/alert Behavior During Therapy: WFL for tasks assessed/performed Overall Cognitive Status: Within Functional Limits for tasks assessed                     General Comments       Exercises       Shoulder Instructions      Home Living Family/patient expects to be discharged to:: Private residence Living Arrangements: Spouse/significant other;Children Available Help at Discharge: Family;Available 24 hours/day Type of Home: Independent living facility Home Access: Level entry     Home Layout: One  level     Bathroom Shower/Tub: Walk-in shower   Bathroom Toilet: Handicapped height     HoProducer, television/film/videome Equipment: Grab bars - toilet;Grab bars - tub/shower;Shower seat - built in;Hand held shower head   Additional Comments: Spouse has dementia; pt has 8 children and amongst them they have arranged for 24/7 care of pt and spouse       Prior Functioning/Environment Level of Independence: Independent        Comments: had sudden decline in function in last 2 weeks  prior to surgery due to severe incr in hip pain; previously independent and attending exercise classes     OT Diagnosis:     OT Problem List:     OT Treatment/Interventions:      OT Goals(Current goals can be found in the care plan section) Acute Rehab OT Goals Patient Stated Goal: [facility]  OT Frequency:     Barriers to D/C:            Co-evaluation              End of Session Nurse Communication:  (removed IV in hand giving pt pain)  Activity Tolerance: Patient tolerated treatment well Patient left: in bed;with call bell/phone within reach;with family/visitor present   Time: [id number] OT Time Calculation (min): 53 min Charges:  OT General Charges $OT Visit: 1 Procedure OT Evaluation $Initial OT Evaluation Tier I: 1 Procedure OT Treatments $Self Care/Home Management : 38-52 mins G-Codes:    Evern BioMayberry, Natasha Bar Lynn 12/20/2013, 10:16 AM [phone]

## 2013-12-20 NOTE — Progress Notes (Signed)
   PATIENT ID: Natasha JunkerPatricia Ford   1 Day Post-Op Procedure(s) (LRB): RIGHT TOTAL HIP ARTHROPLASTY ANTERIOR APPROACH (Right)  Subjective: Doing well this am. Some pain overnight but reports right hip pain is already decreased as compared to prior to sx. Very happy with result. No other complaints or concerns.  Objective:  Filed Vitals:   12/20/13 0559  BP: 100/38  Pulse: 76  Temp: 98.3 F (36.8 C)  Resp: 18     Ant hip dressing c/d/i Wiggles toes, distally NVI  Labs:   Recent Labs  12/20/13 0532  HGB 9.3*   Recent Labs  12/20/13 0532  WBC 7.5  RBC 2.99*  HCT 27.7*  PLT 194   Recent Labs  12/20/13 0532  NA 141  K 3.7  CL 104  CO2 24  BUN 9  CREATININE 0.60  GLUCOSE 113*  CALCIUM 8.2*    Assessment and Plan: 1 day s/p R ant THA Pain well controlled, cont current pain mgmt Up with PT today/OT consulted Likely d/c tomorrow HHPT  VTE proph: ASA 325mg  BID, SCDs

## 2013-12-20 NOTE — Progress Notes (Signed)
Physical Therapy Treatment Patient Details Name: Natasha Ford MRN: [id number] DOB: 04/05/1929 Today's Date: 12/20/2013    History of Present Illness Adm 12/19/13 for Rt direct anterior approach THA. PMHx- Lt wrist ORIF, HTN, OA    PT Comments    Patient is progressing well towards physical therapy goals, ambulating up to 125 feet with supervision while using rolling walker. Pt tolerating therapeutic exercises well and is very motivated to improve function. Patient will continue to benefit from skilled physical therapy services at home to further improve independence with functional mobility.    Follow Up Recommendations  Home health PT;Supervision/Assistance - 24 hour     Equipment Recommendations  Rolling walker with 5" wheels (youth RW--pt hopes to only need a cane)    Recommendations for Other Services OT consult     Precautions / Restrictions Precautions Precautions: Fall Restrictions Weight Bearing Restrictions: Yes RLE Weight Bearing: Weight bearing as tolerated    Mobility  Bed Mobility Overal bed mobility: Needs Assistance Bed Mobility: Supine to Sit     Supine to sit: Supervision Sit to supine: Modified independent (Device/Increase time)   General bed mobility comments: Supine>sit with HOB elevated, no physical assist needed. Requires extra time.  Transfers Overall transfer level: Needs assistance Equipment used: Rolling walker (2 wheeled) Transfers: Sit to/from Stand Sit to Stand: Supervision         General transfer comment: Supervision for safety from lowest bed setting. VCs for hand placement. No loss of balance noted.  Ambulation/Gait Ambulation/Gait assistance: Supervision Ambulation Distance (Feet): 125 Feet Assistive device: Rolling walker (2 wheeled) Gait Pattern/deviations: Step-to pattern;Step-through pattern;Decreased step length - left;Decreased stance time - right;Antalgic   Gait velocity interpretation: Below normal speed for  age/gender General Gait Details: Minimal use of UEs on RW. Intermittent verbal cues for RW placement with turns. Educated on safe DME use. No loss of balance, slow and cautious gait. Focused on step-through gait pattern with good response.   Stairs            Wheelchair Mobility    Modified Rankin (Stroke Patients Only)       Balance                                    Cognition Arousal/Alertness: Awake/alert Behavior During Therapy: WFL for tasks assessed/performed Overall Cognitive Status: Within Functional Limits for tasks assessed                      Exercises Total Joint Exercises Ankle Circles/Pumps: AROM;Both;10 reps Heel Slides: AROM;Both;10 reps;Seated Straight Leg Raises: AAROM;Right;10 reps;Supine Long Arc Quad: AROM;Both;10 reps;Seated    General Comments        Pertinent Vitals/Pain Pt reports pain as "low" and "less than 2" Patient repositioned in chair for comfort.     Home Living Family/patient expects to be discharged to:: Private residence Living Arrangements: Spouse/significant other;Children Available Help at Discharge: Family;Available 24 hours/day Type of Home: Independent living facility Home Access: Level entry   Home Layout: One level Home Equipment: Grab bars - toilet;Grab bars - tub/shower;Shower seat - built in;Hand held shower head Additional Comments: Spouse has dementia; pt has 8 children and amongst them they have arranged for 24/7 care of pt and spouse     Prior Function Level of Independence: Independent      Comments: had sudden decline in function in last 2 weeks prior to surgery due to severe  incr in hip pain; previously independent and attending exercise classes    PT Goals (current goals can now be found in the care plan section) Acute Rehab PT Goals Patient Stated Goal: return to exercise classes at [facility] PT Goal Formulation: With patient Time For Goal Achievement: 12/21/13 Potential to  Achieve Goals: Good Progress towards PT goals: Progressing toward goals    Frequency  7X/week    PT Plan Current plan remains appropriate    Co-evaluation             End of Session   Activity Tolerance: Patient tolerated treatment well Patient left: in chair;with call bell/phone within reach;with family/visitor present     Time: 1610-9604 PT Time Calculation (min): 28 min  Charges:  $Gait Training: 8-22 mins $Therapeutic Exercise: 8-22 mins                    G Codes:      [employer], [place] [phone]  Berton Mount 12/20/2013, 12:41 PM

## 2013-12-21 LAB — CBC
HCT: 27.6 % — ABNORMAL LOW (ref 36.0–46.0)
Hemoglobin: 9.3 g/dL — ABNORMAL LOW (ref 12.0–15.0)
MCH: 31.2 pg (ref 26.0–34.0)
MCHC: 33.7 g/dL (ref 30.0–36.0)
MCV: 92.6 fL (ref 78.0–100.0)
PLATELETS: 206 10*3/uL (ref 150–400)
RBC: 2.98 MIL/uL — ABNORMAL LOW (ref 3.87–5.11)
RDW: 14.8 % (ref 11.5–15.5)
WBC: 7.5 10*3/uL (ref 4.0–10.5)

## 2013-12-21 NOTE — Progress Notes (Signed)
   PATIENT ID: Hazeline JunkerPatricia Sigel   2 Days Post-Op Procedure(s) (LRB): RIGHT TOTAL HIP ARTHROPLASTY ANTERIOR APPROACH (Right)  Subjective: Feeling great this am. Pain controlled with 1 Norco q 4-6 hrs. Ambulated with PT yesterday. Anxious to get home today. No other complaints or concerns.   Objective:  Filed Vitals:   12/21/13 0648  BP: 142/47  Pulse: 76  Temp: 98.1 F (36.7 C)  Resp: 16     Awake, alert, orientated Sitting up in chair Right hip dressing c/d/i Wiggles toes, distally NVI  Labs:   Recent Labs  12/20/13 0532 12/21/13 0703  HGB 9.3* 9.3*   Recent Labs  12/20/13 0532 12/21/13 0703  WBC 7.5 7.5  RBC 2.99* 2.98*  HCT 27.7* 27.6*  PLT 194 206   Recent Labs  12/20/13 0532  NA 141  K 3.7  CL 104  CO2 24  BUN 9  CREATININE 0.60  GLUCOSE 113*  CALCIUM 8.2*    Assessment and Plan: 2 days s/p right ant THA Cleared by PT to go home with HHPT today, said theyd FU this am for one more session Okay to d/c home after that norco for home pain control, scripts signed in chart  VTE proph: ASA 325mg  BID, SCDs

## 2013-12-21 NOTE — Progress Notes (Signed)
Physical Therapy Treatment Patient Details Name: Hazeline Junkeratricia Wimes MRN: [id number] DOB: 03/02/1929 Today's Date: 12/21/2013    History of Present Illness Adm 12/19/13 for Rt direct anterior approach THA. PMHx- Lt wrist ORIF, HTN, OA    PT Comments    Patient progressing very well with physical therapy. Reports increase in "stiffness and soreness" today that improves with mobility. Ambulating up to 225 feet with supervision and use of rolling walker. All education has been reviewed and feel that pt is adequate for d/c from mobility standpoint. She will continue to benefit from skilled physical therapy services at home with HHPT to further improve independence with functional mobility.   Follow Up Recommendations  Home health PT;Supervision/Assistance - 24 hour     Equipment Recommendations  Rolling walker with 5" wheels (youth RW--pt hopes to only need a cane)    Recommendations for Other Services OT consult     Precautions / Restrictions Restrictions Weight Bearing Restrictions: Yes RLE Weight Bearing: Weight bearing as tolerated    Mobility  Bed Mobility Overal bed mobility: Modified Independent             General bed mobility comments: Mod I with all bed mobility. Requires extra time  Transfers Overall transfer level: Needs assistance Equipment used: Rolling walker (2 wheeled) Transfers: Sit to/from Stand Sit to Stand: Supervision         General transfer comment: Supervision for safety from lowest bed setting. Correctly uses hands from stable surface to rise.   Ambulation/Gait Ambulation/Gait assistance: Supervision Ambulation Distance (Feet): 225 Feet Assistive device: Rolling walker (2 wheeled) Gait Pattern/deviations: Step-through pattern;Decreased stride length   Gait velocity interpretation: Below normal speed for age/gender General Gait Details: Continues to improve RW control with turns demonstrating adequate safety with this assistive device. Pt  needed 2 standing rest breaks during ambulatory bout, due to "stiffness and Soreness." Demonstrating good head turns and able to hold conversation during ambulation   Stairs            Wheelchair Mobility    Modified Rankin (Stroke Patients Only)       Balance                                    Cognition Arousal/Alertness: Awake/alert Behavior During Therapy: WFL for tasks assessed/performed Overall Cognitive Status: Within Functional Limits for tasks assessed                      Exercises Total Joint Exercises Ankle Circles/Pumps: AROM;Both;10 reps Heel Slides: Right;AAROM;10 reps;Supine    General Comments        Pertinent Vitals/Pain 2/10 pain Patient repositioned in bed for comfort.     Home Living                      Prior Function            PT Goals (current goals can now be found in the care plan section) Acute Rehab PT Goals PT Goal Formulation: With patient Time For Goal Achievement: 12/21/13 Potential to Achieve Goals: Good Progress towards PT goals: Progressing toward goals    Frequency  7X/week    PT Plan Current plan remains appropriate    Co-evaluation             End of Session   Activity Tolerance: Patient tolerated treatment well Patient left: with call bell/phone within reach;in bed  Time: 0960-4540 PT Time Calculation (min): 19 min  Charges:  $Gait Training: 8-22 mins                    G Codes:      Charlsie Merles, [place] [phone]  [place] 12/21/2013, 1:28 PM

## 2013-12-22 ENCOUNTER — Encounter (HOSPITAL_COMMUNITY): Payer: Self-pay | Admitting: Orthopedic Surgery

## 2013-12-22 NOTE — Discharge Summary (Signed)
Patient ID: Natasha Ford MRN: [id number] DOB/AGE: 78-Apr-1930 78 y.o.  Admit date: 12/19/2013 Discharge date: 12/22/2013  Admission Diagnoses:  Principal Problem:   Osteoarthritis of right hip   Discharge Diagnoses:  Same  Past Medical History  Diagnosis Date  . Hypertension   . Arthritis     Surgeries: Procedure(s): RIGHT TOTAL HIP ARTHROPLASTY ANTERIOR APPROACH on 12/19/2013   Consultants:    Discharged Condition: Improved  Hospital Course: Lexani Corona is an 80 y.o. female who was admitted 12/19/2013 for operative treatment ofOsteoarthritis of right hip. Patient has severe unremitting pain that affects sleep, daily activities, and work/hobbies. After pre-op clearance the patient was taken to the operating room on 12/19/2013 and underwent  Procedure(s): RIGHT TOTAL HIP ARTHROPLASTY ANTERIOR APPROACH.    Patient was given perioperative antibiotics: Anti-infectives   Start     Dose/Rate Route Frequency Ordered Stop   12/19/13 1500  ceFAZolin (ANCEF) IVPB 1 g/50 mL premix     1 g 100 mL/hr over 30 Minutes Intravenous Every 6 hours 12/19/13 1430 12/19/13 2130   12/19/13 1230  ceFAZolin (ANCEF) IVPB 1 g/50 mL premix  Status:  Discontinued     1 g 100 mL/hr over 30 Minutes Intravenous Every 6 hours 12/19/13 1223 12/19/13 1430   12/19/13 0600  ceFAZolin (ANCEF) IVPB 2 g/50 mL premix     2 g 100 mL/hr over 30 Minutes Intravenous On call to O.R. 12/18/13 1424 12/19/13 0750       Patient was given sequential compression devices, early ambulation, and ASA 325mg  BID to prevent DVT.  Patient benefited maximally from hospital stay and there were no complications.    Recent vital signs: Patient Vitals for the past 24 hrs:  BP Temp Pulse Resp SpO2  12/21/13 1338 126/60 mmHg 98.6 F (37 C) 91 18 99 %     Recent laboratory studies:  Recent Labs  12/20/13 0532 12/21/13 0703  WBC 7.5 7.5  HGB 9.3* 9.3*  HCT 27.7* 27.6*  PLT 194 206  NA 141  --   K 3.7  --   CL 104   --   CO2 24  --   BUN 9  --   CREATININE 0.60  --   GLUCOSE 113*  --   CALCIUM 8.2*  --      Discharge Medications:     Medication List    STOP taking these medications       meloxicam 7.5 MG tablet  Commonly known as:  MOBIC      TAKE these medications       aspirin EC 325 MG tablet  Take 1 tablet (325 mg total) by mouth 2 (two) times daily after a meal.     CALTRATE 600 PLUS-VIT D PO  Take 1 tablet by mouth 2 (two) times daily.     cholecalciferol 1000 UNITS tablet  Commonly known as:  VITAMIN D  Take 1,000 Units by mouth 2 (two) times daily.     HYDROcodone-acetaminophen 5-325 MG per tablet  Commonly known as:  NORCO  Take 1 tablet by mouth every 6 (six) hours as needed for severe pain.     lisinopril 10 MG tablet  Commonly known as:  PRINIVIL,ZESTRIL  Take 10 mg by mouth daily at 12 noon.     methocarbamol 500 MG tablet  Commonly known as:  ROBAXIN  Take 1 tablet (500 mg total) by mouth every 12 (twelve) hours as needed for muscle spasms.     PRESERVISION AREDS Tabs  Take 1 tablet by mouth 2 (two) times daily.     VISINE OP  Place 1 drop into both eyes at bedtime.        Diagnostic Studies: Dg Chest 2 View  12/10/2013   CLINICAL DATA:  Hypertension.  EXAM: CHEST  2 VIEW  COMPARISON:  None.  FINDINGS: The heart size and mediastinal contours are within normal limits. Both lungs are clear. No pneumothorax or pleural effusion is noted. Multilevel degenerative disc disease is noted throughout the thoracic spine.  IMPRESSION: No acute cardiopulmonary abnormality seen.   Electronically Signed   By: Roque LiasJames  Green M.D.   On: 12/10/2013 09:55   Dg Hip Operative Right  12/19/2013   CLINICAL DATA:  Right total hip arthroplasty.  EXAM: DG OPERATIVE RIGHT HIP  TECHNIQUE: Two fluoroscopic intraoperative images of the right hip were submitted.  COMPARISON:  Oct 10, 2013.  FINDINGS: Status post right total hip arthroplasty. The right acetabular and femoral components appear to  be well situated. No dislocation is noted.  IMPRESSION: Status post right total hip arthroplasty.   Electronically Signed   By: Roque LiasJames  Green M.D.   On: 12/19/2013 09:54   Dg Pelvis Portable  12/19/2013   CLINICAL DATA:  Right hip prosthesis.  EXAM: PORTABLE PELVIS 1-2 VIEWS  COMPARISON:  10/10/2013.  FINDINGS: The femoral and acetabular components are well seated. No complicating features are demonstrated. The visualized bony pelvis is intact.  IMPRESSION: Well seated right hip prosthesis without complicating features.   Electronically Signed   By: Loralie ChampagneMark  Gallerani M.D.   On: 12/19/2013 11:27   Dg Hip Portable 1 View Right  12/19/2013   CLINICAL DATA:  Hip replacement surgery  EXAM: PORTABLE RIGHT HIP - 1 VIEW  COMPARISON:  Intraoperative radiographs, same date.  FINDINGS: The hip prosthesis is normally located. No complicating features are demonstrated.  IMPRESSION: Hip prosthesis in good position without complicating features.   Electronically Signed   By: Loralie ChampagneMark  Gallerani M.D.   On: 12/19/2013 11:27    Disposition: 01-Home or Self Care      Discharge Instructions   Call MD / Call 911    Complete by:  As directed   If you experience chest pain or shortness of breath, CALL 911 and be transported to the hospital emergency room.  If you develope a fever above 101 F, pus (white drainage) or increased drainage or redness at the wound, or calf pain, call your surgeon's office.     Constipation Prevention    Complete by:  As directed   Drink plenty of fluids.  Prune juice may be helpful.  You may use a stool softener, such as Colace (over the counter) 100 mg twice a day.  Use MiraLax (over the counter) for constipation as needed.     Diet - low sodium heart healthy    Complete by:  As directed      Increase activity slowly as tolerated    Complete by:  As directed      Weight bearing as tolerated    Complete by:  As directed   Laterality:  right  Extremity:  Lower           Follow-up  Information   Follow up with GRAVES,JOHN L, MD. Schedule an appointment as soon as possible for a visit in 2 weeks.   Specialty:  Orthopedic Surgery   Contact information:   [address] [place] [id number] [phone]        Signed: Caleen JobsLALIBERTE,  [place] Robertshaw 12/22/2013, 12:24 PM

## 2014-09-07 IMAGING — RF DG HIP OPERATIVE*R*
1 series · 2 of 2 positions shown · non-contrast
Comparison: October 10, 2013.

CLINICAL DATA: Right total hip arthroplasty.

EXAM:
DG OPERATIVE RIGHT HIP
TECHNIQUE: Two fluoroscopic intraoperative images of the right hip were
submitted.

[Series 1: run · 2 of 2 slices shown]
[im 1/2]
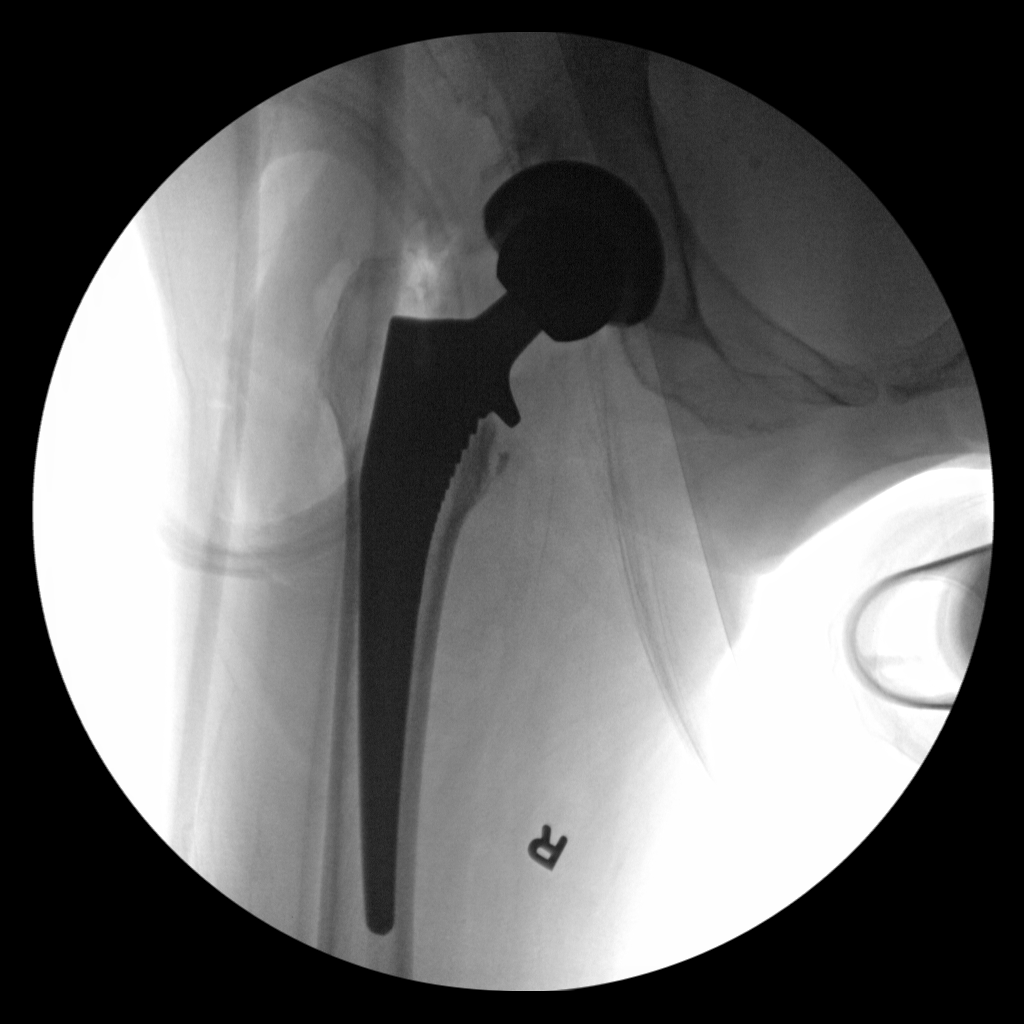
[im 2/2]
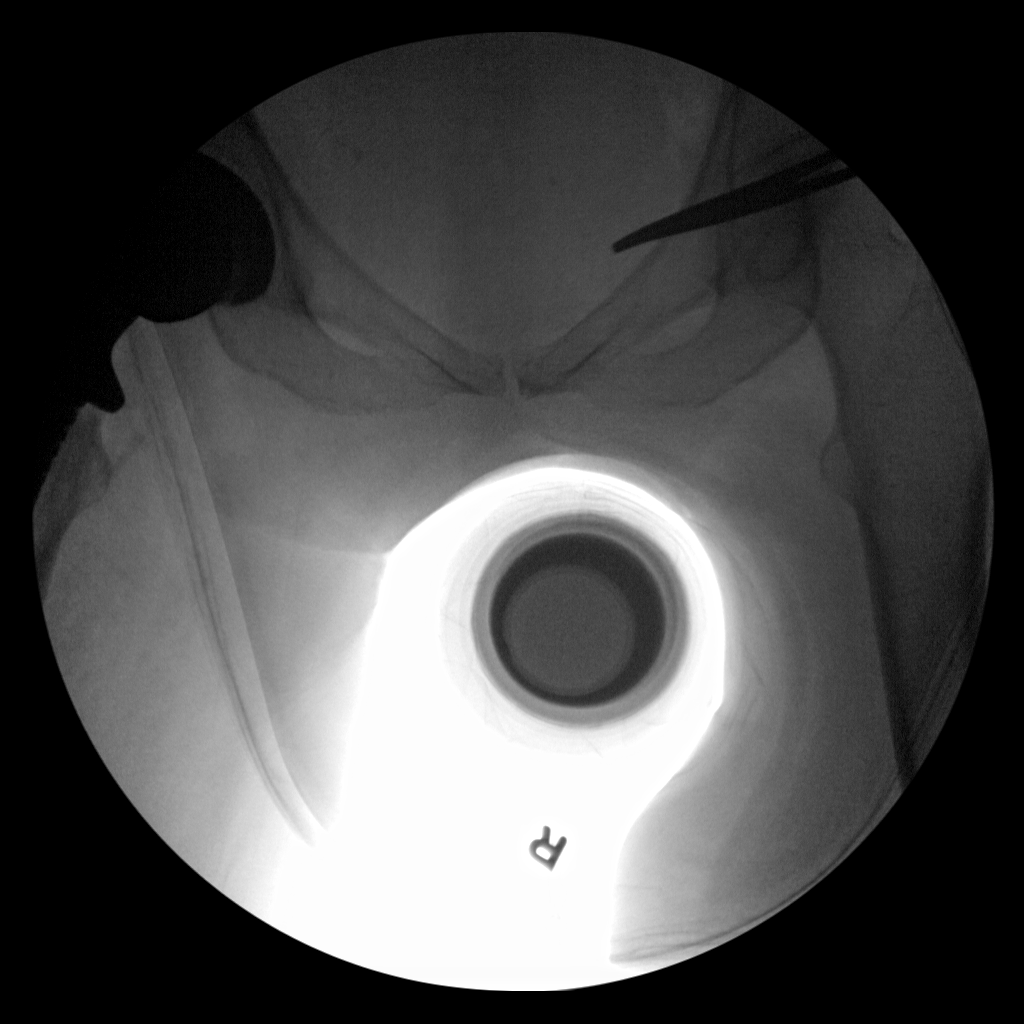

[2 of 2 positions shown; findings below may reference images not displayed]

FINDINGS: Status post right total hip arthroplasty. The right acetabular and
femoral components appear to be well situated. No dislocation is
noted.
IMPRESSION: Status post right total hip arthroplasty.

## 2021-08-22 ENCOUNTER — Ambulatory Visit: Payer: 59 | Admitting: Family Medicine

## 2021-08-22 ENCOUNTER — Encounter: Payer: Self-pay | Admitting: *Deleted

## 2021-08-22 ENCOUNTER — Encounter: Payer: Self-pay | Admitting: Family Medicine

## 2021-08-22 VITALS — BP 141/86 | HR 83 | Ht <= 58 in | Wt 95.4 lb

## 2021-08-22 DIAGNOSIS — E782 Mixed hyperlipidemia: Secondary | ICD-10-CM

## 2021-08-22 DIAGNOSIS — F439 Reaction to severe stress, unspecified: Secondary | ICD-10-CM

## 2021-08-22 DIAGNOSIS — M810 Age-related osteoporosis without current pathological fracture: Secondary | ICD-10-CM

## 2021-08-22 DIAGNOSIS — I10 Essential (primary) hypertension: Secondary | ICD-10-CM

## 2021-08-22 LAB — COMPREHENSIVE METABOLIC PANEL
ALT: 16 U/L (ref 0–35)
AST: 19 U/L (ref 0–37)
Albumin: 4.2 g/dL (ref 3.5–5.2)
Alkaline Phosphatase: 75 U/L (ref 39–117)
BUN: 15 mg/dL (ref 6–23)
CO2: 27 mEq/L (ref 19–32)
Calcium: 10 mg/dL (ref 8.4–10.5)
Chloride: 104 mEq/L (ref 96–112)
Creatinine, Ser: 0.72 mg/dL (ref 0.40–1.20)
GFR: 73.22 mL/min (ref >60.00)
Glucose, Bld: 92 mg/dL (ref 70–99)
Potassium: 4.1 mEq/L (ref 3.5–5.1)
Sodium: 141 mEq/L (ref 135–145)
Total Bilirubin: 0.8 mg/dL (ref 0.2–1.2)
Total Protein: 6.7 g/dL (ref 6.0–8.3)

## 2021-08-22 LAB — LIPID PANEL
Cholesterol: 224 mg/dL — ABNORMAL HIGH (ref 0–200)
HDL: 83.4 mg/dL (ref >39.00)
LDL Cholesterol: 112 mg/dL — ABNORMAL HIGH (ref 0–99)
NonHDL: 140.23
Total CHOL/HDL Ratio: 3
Triglycerides: 141 mg/dL (ref 0.0–149.0)
VLDL: 28.2 mg/dL (ref 0.0–40.0)

## 2021-08-22 LAB — CBC
HCT: 43.7 % (ref 36.0–46.0)
Hemoglobin: 14.5 g/dL (ref 12.0–15.0)
MCHC: 33.1 g/dL (ref 30.0–36.0)
MCV: 95.3 fl (ref 78.0–100.0)
Platelets: 189 10*3/uL (ref 150.0–400.0)
RBC: 4.59 Mil/uL (ref 3.87–5.11)
RDW: 15.2 % (ref 11.5–15.5)
WBC: 4.4 10*3/uL (ref 4.0–10.5)

## 2021-08-22 LAB — TSH: TSH: 1.26 u[IU]/mL (ref 0.35–5.50)

## 2021-08-22 LAB — VITAMIN D 25 HYDROXY (VIT D DEFICIENCY, FRACTURES): VITD: 68.55 ng/mL (ref 30.00–100.00)

## 2021-08-22 MED ORDER — LISINOPRIL 10 MG PO TABS: 10.0000 mg | ORAL_TABLET | Freq: Every day | ORAL | 1 refills | Status: DC

## 2021-08-22 NOTE — Patient Instructions (Signed)
Thank you for choosing [facility] for your Primary Care needs. I am excited for the opportunity to partner with you to meet your health care goals. It was a pleasure meeting you today! ° ° °Information on diet, exercise, and health maintenance recommendations are listed below. This is information to help you be sure you are on track for optimal health and monitoring.  ° °Please look over this and let us know if you have any questions or if you have completed any of the health maintenance outside of [facility] so that we can be sure your records are up to date.  °___________________________________________________________ ° °MyChart:  °For all urgent or time sensitive needs we ask that you please call the office to avoid delays. Our number is [phone]. °MyChart is not constantly monitored and due to the large volume of messages a day, replies may take up to 72 business hours. ° °MyChart Policy: °MyChart allows for you to see your visit notes, after visit summary, provider recommendations, lab and tests results, make an appointment, request refills, and contact your provider or the office for non-urgent questions or concerns. Providers are seeing patients during normal business hours and do not have built in time to review MyChart messages.  °We ask that you allow a minimum of 3 business days for responses to MyChart messages. For this reason, please do not send urgent requests through MyChart. Please call the office at [phone]. °New and ongoing conditions may require a visit. We have virtual and in-person visits available for your convenience.  °Complex MyChart concerns may require a visit. Your provider may request you schedule a virtual or in-person visit to ensure we are providing the best care possible. °MyChart messages sent after 11:00 AM on Friday will not be received by the provider until Monday morning.  °  °Lab and Test Results: °You will receive your lab and  test results on MyChart as soon as they are completed and results have been sent by the lab or testing facility. Due to this service, you will receive your results BEFORE your provider.  °I review lab and test results each morning prior to seeing patients. Some results require collaboration with other providers to ensure you are receiving the most appropriate care. For this reason, we ask that you please allow a minimum of 3-5 business days from the time that ALL results have been received for your provider to receive and review lab and test results and contact you about these.  °Most lab and test result comments from the provider will be sent through MyChart. Your provider may recommend changes to the plan of care, follow-up visits, repeat testing, ask questions, or request an office visit to discuss these results. You may reply directly to this message or call the office to provide information for the provider or set up an appointment. °In some instances, you will be called with test results and recommendations. Please let us know if this is preferred and we will make note of this in your chart to provide this for you.    °If you have not heard a response to your lab or test results in 5 business days from all results returning to MyChart, please call the office to let us know. We ask that you please avoid calling prior to this time unless there is an emergent concern. Due to high call volumes, this can delay the resulting process. ° °After Hours: °For all non-emergency after hours needs,   please call the office at [phone] and select the option to reach the on-call  service. On-call services are shared between multiple [facility] offices and therefore it will not be possible to speak directly with your provider. On-call providers may provide medical advice and recommendations, but are unable to provide refills for maintenance medications.  °For all emergency or urgent medical needs after normal business  hours, we recommend that you seek care at the closest Urgent Care or Emergency Department to ensure appropriate treatment in a timely manner.  °[facility] has a 24 hour emergency room located on the ground floor for your convenience.  ° °Urgent Concerns During the Business Day °Providers are seeing patients from 8AM to 5PM with a busy schedule and are most often not able to respond to non-urgent calls until the end of the day or the next business day. °If you should have URGENT concerns during the day, please call and speak to the nurse or schedule a same day appointment so that we can address your concern without delay.  ° °Thank you, again, for choosing me as your health care partner. I appreciate your trust and look forward to learning more about you.  ° °Luan Urbani B. Josyah Achor, DNP, FNP-C ° °___________________________________________________________ ° °Health Maintenance Recommendations °Screening Testing °Mammogram °Every 1-2 years based on history and risk factors °Starting at age 50 °Pap Smear °Ages 21-39 every 3 years °Ages 30-65 every 5 years with HPV testing °More frequent testing may be required based on results and history °Colon Cancer Screening °Every 1-10 years based on test performed, risk factors, and history °Starting at age 45 °Bone Density Screening °Every 2-10 years based on history °Starting at age 65 for women °Recommendations for men differ based on medication usage, history, and risk factors °AAA Screening °One time ultrasound °Men 65-75 years old who have ever smoked °Lung Cancer Screening °Low Dose Lung CT every 12 months °Age 50-80 years with a 20 pack-year smoking history who still smoke or who have quit within the last 15 years ° °Screening Labs °Routine  Labs: Complete Blood Count (CBC), Complete Metabolic Panel (CMP), Cholesterol (Lipid Panel) °Every 6-12 months based on history and medications °May be recommended more frequently based on current conditions or  previous results °Hemoglobin A1c Lab °Every 3-12 months based on history and previous results °Starting at age 45 or earlier with diagnosis of diabetes, high cholesterol, BMI >26, and/or risk factors °Frequent monitoring for patients with diabetes to ensure blood sugar control °Thyroid Panel (TSH w/ T3 & T4) °Every 6 months based on history, symptoms, and risk factors °May be repeated more often if on medication °HIV °One time testing for all patients 13 and older °May be repeated more frequently for patients with increased risk factors or exposure °Hepatitis C °One time testing for all patients 18 and older °May be repeated more frequently for patients with increased risk factors or exposure °Gonorrhea, Chlamydia °Every 12 months for all sexually active persons 13-24 years °Additional monitoring may be recommended for those who are considered high risk or who have symptoms °PSA °Men 40-54 years old with risk factors °Additional screening may be recommended from age 55-69 based on risk factors, symptoms, and history ° °Vaccine Recommendations °Tetanus Booster °All adults every 10 years °Flu Vaccine °All patients 6 months and older every year °COVID Vaccine °All patients 12 years and older °Initial dosing with booster °May recommend additional booster based on age and health history °HPV Vaccine °2 doses all patients   age 9-26 °Dosing may be considered for patients over 26 °Shingles Vaccine (Shingrix) °2 doses all adults 50 years and older °Pneumonia (Pneumovax 23) °All adults 65 years and older °May recommend earlier dosing based on health history °Pneumonia (Prevnar 13) °All adults 65 years and older °Dosed 1 year after Pneumovax 23 °Pneumonia (Prevnar 20) °All adults 65 years and older (adults 19-64 with certain conditions or risk factors) °1 dose  °For those who have no received Prevnar 13 vaccine previously ° ° °Additional Screening, Testing, and Vaccinations may be recommended on an individualized basis based on  family history, health history, risk factors, and/or exposure.  °__________________________________________________________ ° °Diet Recommendations for All Patients ° °I recommend that all patients maintain a diet low in saturated fats, carbohydrates, and cholesterol. While this can be challenging at first, it is not impossible and small changes can make big differences.  °Things to try: °Decreasing the amount of soda, sweet tea, and/or juice to one or less per day and replace with water °While water is always the first choice, if you do not like water you may consider °adding a water additive without sugar to improve the taste °other sugar free drinks °Replace potatoes with a brightly colored vegetable at dinner °Use healthy oils, such as canola oil or olive oil, instead of butter or hard margarine °Limit your bread intake to two pieces or less a day °Replace regular pasta with low carb pasta options °Bake, broil, or grill foods instead of frying °Monitor portion sizes  °Eat smaller, more frequent meals throughout the day instead of large meals ° °An important thing to remember is, if you love foods that are not great for your health, you don't have to give them up completely. Instead, allow these foods to be a reward when you have done well. Allowing yourself to still have special treats every once in a while is a nice way to tell yourself thank you for working hard to keep yourself healthy.  ° °Also remember that every day is a new day. If you have a bad day and "fall off the wagon", you can still climb right back up and keep moving along on your journey! ° °We have resources available to help you!  °Some websites that may be helpful include: °www.MyPlate.gov  °Www.VeryWellFit.com °_____________________________________________________________ ° °Activity Recommendations for All Patients ° °I recommend that all adults get at least 20 minutes of moderate physical activity that elevates your heart rate at least 5  days out of the week.  °Some examples include: °Walking or jogging at a pace that allows you to carry on a conversation °Cycling (stationary bike or outdoors) °Water aerobics °Yoga °Weight lifting °Dancing °If physical limitations prevent you from putting stress on your joints, exercise in a pool or seated in a chair are excellent options. ° °Do determine your MAXIMUM heart rate for activity: 220 - YOUR AGE = MAX Heart Rate  ° °Remember! °Do not push yourself too hard.  °Start slowly and build up your pace, speed, weight, time in exercise, etc.  °Allow your body to rest between exercise and get good sleep. °You will need more water than normal when you are exerting yourself. Do not wait until you are thirsty to drink. Drink with a purpose of getting in at least 8, 8 ounce glasses of water a day plus more depending on how much you exercise and sweat.  ° ° °If you begin to develop dizziness, chest pain, abdominal pain, jaw pain, shortness of breath, headache,   vision changes, lightheadedness, or other concerning symptoms, stop the activity and allow your body to rest. If your symptoms are severe, seek emergency evaluation immediately. If your symptoms are concerning, but not severe, please let us know so that we can recommend further evaluation.  ° ° ° °

## 2021-08-22 NOTE — Progress Notes (Signed)
? ?______________________________________________________________________ ? ?HPI ?Natasha Ford is a 88 y.o. female presenting to [facility] at [facility] today to establish care.   ? ?Patient Care Team: ?Clayborne Dana, NP as PCP - General (Family Medicine) ? ?Health Maintenance  ?Topic Date Due  ? Tetanus Vaccine  Never done  ? Zoster (Shingles) Vaccine (1 of 2) Never done  ? Pneumonia Vaccine (1 - PCV) Never done  ? DEXA scan (bone density measurement)  Never done  ? COVID-19 Vaccine (3 - Booster for Moderna series) 09/02/2019  ? Flu Shot  Never done  ? HPV Vaccine  Aged Out  ? ? ? ?Concerns today: ?HTN - lisinopril 10 mg daily; does not consistently check BP at home, asymptomatic (denies chest pain, dyspnea, recurrent headaches, vision changes, palpitations, edea); diet = regular, low-sodium  ?Vitamin D deficiency/osteoporosis - takes calcium and vitamin D supplements, no falls/injuries; did fosamax for 10 years then switched to Prolia for 5 years (stopped 4-5 years ago), no fall history ?HLD - lifestyle management  ?COVID in February - since then has been more easily stressed, can't determine any specific triggers; reports a lot of brain fog during/after COVID infection; also learning to drive a new car so that has been an adjustment as well   ? ? ?Patient Active Problem List  ? Diagnosis Date Noted  ? Osteoarthritis of right hip 12/19/2013  ? ? ? ?______________________________________________________________________ ?PMH ?Past Medical History:  ?Diagnosis Date  ? Arthritis   ? Hypertension   ? ? ?ROS ?All review of systems negative except what is listed in the HPI ? ?PHYSICAL EXAM ?Physical Exam ?Vitals reviewed.  ?Constitutional:   ?   Appearance: Normal appearance. She is normal weight.  ?HENT:  ?   Head: Normocephalic and atraumatic.  ?Cardiovascular:  ?   Rate and Rhythm: Normal rate and regular rhythm.  ?   Pulses: Normal pulses.  ?   Heart sounds: Normal heart sounds.   ?Pulmonary:  ?   Effort: Pulmonary effort is normal.  ?   Breath sounds: Normal breath sounds.  ?Musculoskeletal:  ?   Cervical back: Normal range of motion and neck supple. No tenderness.  ?Lymphadenopathy:  ?   Cervical: No cervical adenopathy.  ?Neurological:  ?   Mental Status: She is alert.  ? ?______________________________________________________________________ ?ASSESSMENT AND PLAN ?1. Osteoporosis without current pathological fracture, unspecified osteoporosis type ?Updating labs today. Declines further DEXA imaging. Continue Calcium and Vitamin D supplementation ?- CBC ?- Comprehensive metabolic panel ?- Lipid panel ?- TSH ?- VITAMIN D 25 Hydroxy (Vit-D Deficiency, Fractures) ? ?2. Primary hypertension ?Stable, no medication changes at this time given age. She is requesting insurance form to be filled out, but will need recheck and bring home log in the next several weeks to see if <140/80 or consistently in that range at home. Patient aware of signs/symptoms requiring further/urgent evaluation.  ?- CBC ?- Comprehensive metabolic panel ?- Lipid panel ?- TSH ?- VITAMIN D 25 Hydroxy (Vit-D Deficiency, Fractures) ?- lisinopril (ZESTRIL) 10 MG tablet; Take 1 tablet (10 mg total) by mouth daily at 12 noon.  Dispense: 90 tablet; Refill: 1 ? ?3. Mixed hyperlipidemia ?Continue lifestyle management. Repeat labs today - not fasting.  ?- Comprehensive metabolic panel ?- Lipid panel ? ?4. Stress ?Possibly post-COVID related (per UpToDate, up to 23% of COVID patients can experience anxiety, depression, PTSD, etc for months following infection). She declines needing any further intervention at this time. No SI/HI. Patient aware of signs/symptoms requiring  further/urgent evaluation.  ? ? ?Establish care  ?Education provided today during visit and on AVS for patient to review at home.  ?Diet and Exercise recommendations provided.  ?Current diagnoses and recommendations discussed. ?HM recommendations reviewed with  recommendations.  ? ? ?Outpatient Encounter Medications as of 08/22/2021  ?Medication Sig  ? Ascorbic Acid (VITAMIN C) 1000 MG tablet Take 1,000 mg by mouth daily.  ? Calcium-Vitamin D (CALTRATE 600 PLUS-VIT D PO) Take 1 tablet by mouth 2 (two) times daily.  ? cholecalciferol (VITAMIN D) 1000 UNITS tablet Take 1,000 Units by mouth 2 (two) times daily.  ? Multiple Vitamins-Minerals (HAIR SKIN NAILS) CAPS Take 3,000 mg by mouth.  ? Multiple Vitamins-Minerals (PRESERVISION AREDS) TABS Take 1 tablet by mouth 2 (two) times daily.  ? Tetrahydrozoline HCl (VISINE OP) Place 1 drop into both eyes at bedtime.  ? [DISCONTINUED] lisinopril (PRINIVIL,ZESTRIL) 10 MG tablet Take 10 mg by mouth daily at 12 noon.  ? lisinopril (ZESTRIL) 10 MG tablet Take 1 tablet (10 mg total) by mouth daily at 12 noon.  ? [DISCONTINUED] aspirin EC 325 MG tablet Take 1 tablet (325 mg total) by mouth 2 (two) times daily after a meal.  ? [DISCONTINUED] HYDROcodone-acetaminophen (NORCO) 5-325 MG per tablet Take 1 tablet by mouth every 6 (six) hours as needed for severe pain.  ? [DISCONTINUED] methocarbamol (ROBAXIN) 500 MG tablet Take 1 tablet (500 mg total) by mouth every 12 (twelve) hours as needed for muscle spasms.  ? ?No facility-administered encounter medications on file as of 08/22/2021.  ? ? ?Return for nurse visit BP check 2-3 weeks; routine f/u w Ladona Ridgel in July . ? ? ? ?Lollie Marrow Reola Calkins, DNP, FNP-C ? ? ?

## 2021-09-06 ENCOUNTER — Ambulatory Visit (INDEPENDENT_AMBULATORY_CARE_PROVIDER_SITE_OTHER): Payer: 59

## 2021-09-06 DIAGNOSIS — I10 Essential (primary) hypertension: Secondary | ICD-10-CM

## 2021-09-06 NOTE — Progress Notes (Signed)
Pt here for Blood pressure check per provider Clayborne Dana, NP ? ?Pt currently takes:lisinopril (ZESTRIL) 10 MG tabletTake 1 tablet (10 mg total) by mouth daily at 12 noon ? ?BP on 08/22/21 was BP 141/86 Pulse 83 ? ?BP today @ =134/78 Pulse93 ? ?Pt advised per provider order return in July for follow up visit  ? ?

## 2021-10-10 ENCOUNTER — Telehealth: Payer: Self-pay | Admitting: Family Medicine

## 2021-10-10 DIAGNOSIS — I10 Essential (primary) hypertension: Secondary | ICD-10-CM

## 2021-10-10 NOTE — Telephone Encounter (Signed)
Pt called stating she needed a refill on her lisinopril. Pt also stated that she brought it to Taylor's attention at her last appt with her but got a call from her insurance company saying it was time to get a refill. Please Advise. ? ?Medication:  ? ?lisinopril (ZESTRIL) 10 MG tablet [[id number]]  ? ?Has the patient contacted their pharmacy? No. ?(If no, request that the patient contact the pharmacy for the refill.) ?(If yes, when and what did the pharmacy advise?) ? ?Preferred Pharmacy (with phone number or street name):  ? ?[facility] - [place], [place] - [address] [facility] AT Portal to Registered Caremark Sites  ?[address], [place] [address]  ?Phone:  [phone]  Fax:  [phone]  ? ?Agent: Please be advised that RX refills may take up to 3 business days. We ask that you follow-up with your pharmacy. ? ?

## 2021-10-21 ENCOUNTER — Telehealth: Payer: Self-pay | Admitting: Family Medicine

## 2021-10-21 MED ORDER — LISINOPRIL 10 MG PO TABS: 10.0000 mg | ORAL_TABLET | Freq: Every day | ORAL | 1 refills | Status: DC

## 2021-10-21 NOTE — Addendum Note (Signed)
Addended by: Juel Burrow on: 10/21/2021 09:43 AM ? ? Modules accepted: Orders ? ?

## 2021-10-21 NOTE — Telephone Encounter (Signed)
Pt called in on 5/1 for medication refilled. Pt states she needs this medication.  ?

## 2021-10-21 NOTE — Telephone Encounter (Signed)
Opened in error

## 2021-10-21 NOTE — Telephone Encounter (Signed)
A 6 month supply was sent to [facility] on March 13 at her [facility]. ?

## 2021-12-19 ENCOUNTER — Ambulatory Visit: Payer: 59 | Admitting: Family Medicine

## 2021-12-19 ENCOUNTER — Encounter: Payer: Self-pay | Admitting: Family Medicine

## 2021-12-19 VITALS — BP 137/75 | HR 79 | Ht <= 58 in | Wt 96.4 lb

## 2021-12-19 DIAGNOSIS — M810 Age-related osteoporosis without current pathological fracture: Secondary | ICD-10-CM | POA: Diagnosis not present

## 2021-12-19 DIAGNOSIS — E782 Mixed hyperlipidemia: Secondary | ICD-10-CM | POA: Insufficient documentation

## 2021-12-19 DIAGNOSIS — I10 Essential (primary) hypertension: Secondary | ICD-10-CM | POA: Insufficient documentation

## 2021-12-19 NOTE — Assessment & Plan Note (Signed)
Blood pressure is at goal for age and co-morbidities.  I recommend continuing lisinopril.   - BP goal <130/80 - monitor and log blood pressures at home - check around the same time each day in a relaxed setting - Limit salt to <2000 mg/day - Follow DASH eating plan (heart healthy diet) - limit alcohol to 2 standard drinks per day for men and 1 per day for women - avoid tobacco products - get at least 2 hours of regular aerobic exercise weekly Patient aware of signs/symptoms requiring further/urgent evaluation. Labs updated deferred. Recheck at next follow-up.

## 2021-12-19 NOTE — Assessment & Plan Note (Signed)
-  Reviewed most recent lipid panel -Medication management: continue lifestyle measures -Diet low in saturated fat -Regular exercise - at least 30 minutes, 5 times per week

## 2021-12-19 NOTE — Patient Instructions (Signed)
Good to see you today! Glad to hear you are doing well. No changes today. Continue lisinopril, your vitamins/supplements, healthy diet, and exercise.

## 2021-12-19 NOTE — Progress Notes (Signed)
Established Patient Office Visit  Subjective   Patient ID: Natasha Ford, female    DOB: January 27, 1929  Age: 88 y.o. MRN: [id number]  CC: routine f/u    HPI  Patient reports she is doing well overall.  She is living at [employer] [employer]. No concerns today.    HYPERTENSION: - Medications: lisinopril 10 mg daily - Compliance: good - Checking BP at home: yes, consistently <120/80 - Denies any SOB, recurrent headaches, CP, vision changes, LE edema, dizziness, palpitations, or medication side effects. - Diet: trying for heart healthy, 3 meals/day - Exercise: walking daily    HYPERLIPIDEMIA - medications: none, lifestyle management only  - compliance: n/a - medication SEs: n/a The ASCVD Risk score (Arnett DK, et al., 2019) failed to calculate for the following reasons:   The 2019 ASCVD risk score is only valid for ages 24 to 9   Vitamin D Deficiency/Osteoporosis: - taking supplementation (Calcium-Vitamin D 600 BID; cholecalciferol 1,000 units BID) - no recent falls/fractures/injuries        ROS All review of systems negative except what is listed in the HPI    Objective:     BP 137/75   Pulse 79   Ht 4\' 10"  (1.473 m)   Wt 96 lb 6.4 oz (43.7 kg)   BMI 20.15 kg/m    Physical Exam Vitals reviewed.  Constitutional:      General: She is not in acute distress.    Appearance: Normal appearance. She is normal weight. She is not ill-appearing.  Cardiovascular:     Rate and Rhythm: Normal rate and regular rhythm.  Pulmonary:     Effort: Pulmonary effort is normal.     Breath sounds: Normal breath sounds.  Musculoskeletal:     Right lower leg: No edema.     Left lower leg: No edema.  Skin:    General: Skin is warm and dry.  Neurological:     General: No focal deficit present.     Mental Status: She is alert and oriented to person, place, and time. Mental status is at baseline.  Psychiatric:        Mood and Affect: Mood normal.        Behavior:  Behavior normal.        Thought Content: Thought content normal.        Judgment: Judgment normal.      No results found for any visits on 12/19/21.    The ASCVD Risk score (Arnett DK, et al., 2019) failed to calculate for the following reasons:   The 2019 ASCVD risk score is only valid for ages 84 to 71    Assessment & Plan:   Problem List Items Addressed This Visit       Cardiovascular and Mediastinum   Primary hypertension - Primary    Blood pressure is at goal for age and co-morbidities.  I recommend continuing lisinopril.   - BP goal <130/80 - monitor and log blood pressures at home - check around the same time each day in a relaxed setting - Limit salt to <2000 mg/day - Follow DASH eating plan (heart healthy diet) - limit alcohol to 2 standard drinks per day for men and 1 per day for women - avoid tobacco products - get at least 2 hours of regular aerobic exercise weekly Patient aware of signs/symptoms requiring further/urgent evaluation. Labs updated deferred. Recheck at next follow-up.         Musculoskeletal and Integument   Osteoporosis without current  pathological fracture    Continue calcium and vitamin D supplementation.  Recommend routine exercise, weight bearing activities.  Safety discussed. No recent falls.         Other   Mixed hyperlipidemia    -Reviewed most recent lipid panel -Medication management: continue lifestyle measures -Diet low in saturated fat -Regular exercise - at least 30 minutes, 5 times per week        Return for 4-6 months routine f/u .    Clayborne Dana, NP

## 2021-12-19 NOTE — Assessment & Plan Note (Signed)
Continue calcium and vitamin D supplementation.  Recommend routine exercise, weight bearing activities.  Safety discussed. No recent falls.  

## 2022-04-13 ENCOUNTER — Telehealth: Payer: Self-pay | Admitting: Family Medicine

## 2022-04-13 ENCOUNTER — Other Ambulatory Visit: Payer: Self-pay | Admitting: *Deleted

## 2022-04-13 DIAGNOSIS — I10 Essential (primary) hypertension: Secondary | ICD-10-CM

## 2022-04-13 MED ORDER — LISINOPRIL 10 MG PO TABS: 10.0000 mg | ORAL_TABLET | Freq: Every day | ORAL | 1 refills | Status: DC

## 2022-04-13 NOTE — Telephone Encounter (Signed)
Medication:   lisinopril (ZESTRIL) 10 MG tablet [[id number]]   Has the patient contacted their pharmacy? No. (If no, request that the patient contact the pharmacy for the refill.) (If yes, when and what did the pharmacy advise?)  Preferred Pharmacy (with phone number or street name):   [facility] [place] to Registered [address] [place], [place] [address] Phone: [phone]  Fax: [phone]   Agent: Please be advised that RX refills may take up to 3 business days. We ask that you follow-up with your pharmacy.

## 2022-04-13 NOTE — Telephone Encounter (Signed)
Refill sent to pharmacy.   

## 2022-05-15 ENCOUNTER — Ambulatory Visit: Payer: 59 | Admitting: Family Medicine

## 2022-05-15 ENCOUNTER — Encounter: Payer: Self-pay | Admitting: Family Medicine

## 2022-05-15 VITALS — BP 138/71 | HR 75 | Resp 18 | Ht <= 58 in | Wt 98.2 lb

## 2022-05-15 DIAGNOSIS — E782 Mixed hyperlipidemia: Secondary | ICD-10-CM | POA: Diagnosis not present

## 2022-05-15 DIAGNOSIS — M65331 Trigger finger, right middle finger: Secondary | ICD-10-CM

## 2022-05-15 DIAGNOSIS — M810 Age-related osteoporosis without current pathological fracture: Secondary | ICD-10-CM | POA: Diagnosis not present

## 2022-05-15 DIAGNOSIS — I10 Essential (primary) hypertension: Secondary | ICD-10-CM | POA: Diagnosis not present

## 2022-05-15 LAB — COMPREHENSIVE METABOLIC PANEL
ALT: 16 U/L (ref 0–35)
AST: 20 U/L (ref 0–37)
Albumin: 4.3 g/dL (ref 3.5–5.2)
Alkaline Phosphatase: 83 U/L (ref 39–117)
BUN: 23 mg/dL (ref 6–23)
CO2: 26 mEq/L (ref 19–32)
Calcium: 9.2 mg/dL (ref 8.4–10.5)
Chloride: 104 mEq/L (ref 96–112)
Creatinine, Ser: 0.8 mg/dL (ref 0.40–1.20)
GFR: 64.19 mL/min (ref >60.00)
Glucose, Bld: 104 mg/dL — ABNORMAL HIGH (ref 70–99)
Potassium: 3.9 mEq/L (ref 3.5–5.1)
Sodium: 139 mEq/L (ref 135–145)
Total Bilirubin: 0.8 mg/dL (ref 0.2–1.2)
Total Protein: 7.1 g/dL (ref 6.0–8.3)

## 2022-05-15 LAB — CBC
HCT: 45.3 % (ref 36.0–46.0)
Hemoglobin: 15.3 g/dL — ABNORMAL HIGH (ref 12.0–15.0)
MCHC: 33.6 g/dL (ref 30.0–36.0)
MCV: 96.1 fl (ref 78.0–100.0)
Platelets: 214 10*3/uL (ref 150.0–400.0)
RBC: 4.72 Mil/uL (ref 3.87–5.11)
RDW: 15.2 % (ref 11.5–15.5)
WBC: 4.5 10*3/uL (ref 4.0–10.5)

## 2022-05-15 LAB — LIPID PANEL
Cholesterol: 227 mg/dL — ABNORMAL HIGH (ref 0–200)
HDL: 94.6 mg/dL (ref >39.00)
LDL Cholesterol: 114 mg/dL — ABNORMAL HIGH (ref 0–99)
NonHDL: 132.2
Total CHOL/HDL Ratio: 2
Triglycerides: 90 mg/dL (ref 0.0–149.0)
VLDL: 18 mg/dL (ref 0.0–40.0)

## 2022-05-15 LAB — TSH: TSH: 1.15 u[IU]/mL (ref 0.35–5.50)

## 2022-05-15 LAB — VITAMIN D 25 HYDROXY (VIT D DEFICIENCY, FRACTURES): VITD: 71.93 ng/mL (ref 30.00–100.00)

## 2022-05-15 NOTE — Assessment & Plan Note (Signed)
Continue calcium and vitamin D supplementation.  Recommend routine exercise, weight bearing activities.  Safety discussed. No recent falls.

## 2022-05-15 NOTE — Progress Notes (Signed)
Established Patient Office Visit  Subjective   Patient ID: Natasha Ford, female    DOB: 23-May-1929  Age: 88 y.o. MRN: [id number]  CC: routine f/u    HPI  Patient reports she is doing well overall.  She is living at [employer] [employer]. No concerns today.     HYPERTENSION: - Medications: lisinopril 10 mg daily - Compliance: good - Checking BP at home: yes, consistently <120/80 - Denies any SOB, recurrent headaches, CP, vision changes, LE edema, dizziness, palpitations, or medication side effects. - Diet: trying for heart healthy, 3 meals/day - Exercise: walking daily, recently started an exercise class    HYPERLIPIDEMIA - medications: none, lifestyle management only  - compliance: n/a - medication SEs: n/a The ASCVD Risk score (Arnett DK, et al., 2019) failed to calculate for the following reasons:   The 2019 ASCVD risk score is only valid for ages 12 to 4   Vitamin D Deficiency/Osteoporosis: - taking supplementation (Calcium-Vitamin D 600 BID; cholecalciferol 1,000 units BID) - no recent falls/fractures/injuries   Trigger finger/arthritis (right middle finger): - finger will often get stuck and she has to straighten it out, worse later in the day "hand is lamost useless by the end of the day" - not painful - no swelling         ROS All review of systems negative except what is listed in the HPI    Objective:     BP 138/71   Pulse 75   Resp 18   Ht 4\' 10"  (1.473 m)   Wt 98 lb 3.2 oz (44.5 kg)   SpO2 98%   BMI 20.52 kg/m    Physical Exam Vitals reviewed.  Constitutional:      General: She is not in acute distress.    Appearance: Normal appearance. She is normal weight. She is not ill-appearing.  Cardiovascular:     Rate and Rhythm: Normal rate and regular rhythm.  Pulmonary:     Effort: Pulmonary effort is normal.     Breath sounds: Normal breath sounds.  Musculoskeletal:     Right lower leg: No edema.     Left lower leg: No  edema.  Skin:    General: Skin is warm and dry.  Neurological:     General: No focal deficit present.     Mental Status: She is alert and oriented to person, place, and time. Mental status is at baseline.  Psychiatric:        Mood and Affect: Mood normal.        Behavior: Behavior normal.        Thought Content: Thought content normal.        Judgment: Judgment normal.      No results found for any visits on 05/15/22.    The ASCVD Risk score (Arnett DK, et al., 2019) failed to calculate for the following reasons:   The 2019 ASCVD risk score is only valid for ages 29 to 57    Assessment & Plan:   Problem List Items Addressed This Visit       Cardiovascular and Mediastinum   Primary hypertension - Primary    Blood pressure is at goal for age and co-morbidities.  I recommend continuing lisinopril.   - BP goal <130/80 - monitor and log blood pressures at home - check around the same time each day in a relaxed setting - Limit salt to <2000 mg/day - Follow DASH eating plan (heart healthy diet) - limit alcohol to 2 standard  drinks per day for men and 1 per day for women - avoid tobacco products - get at least 2 hours of regular aerobic exercise weekly Patient aware of signs/symptoms requiring further/urgent evaluation. Labs updated- fasting         Musculoskeletal and Integument   Osteoporosis without current pathological fracture (Chronic)    Continue calcium and vitamin D supplementation.  Recommend routine exercise, weight bearing activities.  Safety discussed. No recent falls.         Other   Mixed hyperlipidemia (Chronic)    -Medication management: continue lifestyle measures -Diet low in saturated fat -Regular exercise - at least 30 minutes, 5 times per week -Labs today        Other Visit Diagnoses     Trigger middle finger of right hand      Referral to sports medicine     Orders Placed This Encounter  Procedures   CBC   Comprehensive metabolic  panel   Lipid panel   TSH   VITAMIN D 25 Hydroxy (Vit-D Deficiency, Fractures)   Ambulatory referral to Sports Medicine    Referral Priority:   Routine    Referral Type:   Consultation    Number of Visits Requested:   1     Return in about 6 months (around 11/14/2022) for routine follow-up.    Natasha Dana, NP

## 2022-05-15 NOTE — Assessment & Plan Note (Signed)
Blood pressure is at goal for age and co-morbidities.  I recommend continuing lisinopril.   - BP goal <130/80 - monitor and log blood pressures at home - check around the same time each day in a relaxed setting - Limit salt to <2000 mg/day - Follow DASH eating plan (heart healthy diet) - limit alcohol to 2 standard drinks per day for men and 1 per day for women - avoid tobacco products - get at least 2 hours of regular aerobic exercise weekly Patient aware of signs/symptoms requiring further/urgent evaluation. Labs updated- fasting

## 2022-05-15 NOTE — Assessment & Plan Note (Signed)
-  Medication management: continue lifestyle measures -Diet low in saturated fat -Regular exercise - at least 30 minutes, 5 times per week -Labs today

## 2022-05-15 NOTE — Patient Instructions (Signed)
   Cardiovascular and Mediastinum   Primary hypertension - Primary    Blood pressure is at goal for age and co-morbidities.  I recommend continuing lisinopril.   - BP goal <130/80 - monitor and log blood pressures at home - check around the same time each day in a relaxed setting - Limit salt to <2000 mg/day - Follow DASH eating plan (heart healthy diet) - limit alcohol to 2 standard drinks per day for men and 1 per day for women - avoid tobacco products - get at least 2 hours of regular aerobic exercise weekly Patient aware of signs/symptoms requiring further/urgent evaluation. Labs updated- fasting         Musculoskeletal and Integument   Osteoporosis without current pathological fracture (Chronic)    Continue calcium and vitamin D supplementation.  Recommend routine exercise, weight bearing activities.  Safety discussed. No recent falls.         Other   Mixed hyperlipidemia (Chronic)    -Medication management: continue lifestyle measures -Diet low in saturated fat -Regular exercise - at least 30 minutes, 5 times per week -Labs today        Other Visit Diagnoses     Trigger middle finger of right hand      Referral to sports medicine

## 2022-05-19 ENCOUNTER — Ambulatory Visit: Payer: 59 | Admitting: Family Medicine

## 2022-05-19 ENCOUNTER — Encounter: Payer: Self-pay | Admitting: Family Medicine

## 2022-05-19 ENCOUNTER — Ambulatory Visit: Payer: Self-pay

## 2022-05-19 VITALS — BP 118/76 | Ht <= 58 in | Wt 95.0 lb

## 2022-05-19 DIAGNOSIS — M6530 Trigger finger, unspecified finger: Secondary | ICD-10-CM

## 2022-05-19 MED ORDER — TRIAMCINOLONE ACETONIDE 40 MG/ML IJ SUSP
40.0000 mg | Freq: Once | INTRAMUSCULAR | Status: AC
  Administered 2022-05-19: 40 mg via INTRA_ARTICULAR

## 2022-05-19 NOTE — Assessment & Plan Note (Signed)
Acutely occurring.  Triggering of the third right flexor tendon.  Does have a fair amount of arthritis at the MCP joints. -Counseled on home exercise therapy and supportive care. - injection today  - counseled on splinting

## 2022-05-19 NOTE — Addendum Note (Signed)
Addended by: Cephas Darby on: 05/19/2022 08:48 AM   Modules accepted: Orders

## 2022-05-19 NOTE — Progress Notes (Signed)
  Natasha Ford - 88 y.o. female MRN [id number]  Date of birth: [birth year]  SUBJECTIVE:  Including CC & ROS.  No chief complaint on file.   Natasha Ford is a 88 y.o. female that is presenting with triggering of the right third digit.  Gets worse over the course of the day.  No significant pain but unable to use her hand.    Review of Systems See HPI   HISTORY: Past Medical, Surgical, Social, and Family History Reviewed & Updated per EMR.   Pertinent Historical Findings include:  Past Medical History:  Diagnosis Date   Arthritis    Hypertension     Past Surgical History:  Procedure Laterality Date   APPENDECTOMY     EYE SURGERY     TOTAL HIP ARTHROPLASTY Right 12/19/2013   Procedure: RIGHT TOTAL HIP ARTHROPLASTY ANTERIOR APPROACH;  Surgeon: Harvie Junior, MD;  Location: [facility] OR;  Service: Orthopedics;  Laterality: Right;   WRIST ARTHROPLASTY     has plate     PHYSICAL EXAM:  VS: BP 118/76   Ht 4\' 10"  (1.473 m)   Wt 95 lb (43.1 kg)   BMI 19.86 kg/m  Physical Exam Gen: NAD, alert, cooperative with exam, well-appearing MSK:  Neurovascularly intact    Limited ultrasound: Right trigger finger:  Moderate amount of arthritis and effusion in each of the metacarpal joints. Nodule appreciated to the third flexor tendon.  Summary: Findings consistent with third trigger finger.  Ultrasound and interpretation by , MD  Aspiration/Injection Procedure Note Natasha Ford 04/09/1929  Procedure: Injection Indications: Right middle trigger finger  Procedure Details Consent: Risks of procedure as well as the alternatives and risks of each were explained to the (patient/caregiver).  Consent for procedure obtained. Time Out: Verified patient identification, verified procedure, site/side was marked, verified correct patient position, special equipment/implants available, medications/allergies/relevent history reviewed, required imaging and test results  available.  Performed.  The area was cleaned with iodine and alcohol swabs.    The right third flexor tendon sheath kwas injected using 1 cc of 1% lidocaine on a 25-gauge 1-1/2 inch needle.  The syringe was switched to mixture containing 1 cc's of 40 mg Kenalog and 1 cc's of 0.25% bupivacaine was injected.  Ultrasound was used. Images were obtained in long views showing the injection.     A sterile dressing was applied.  Patient did tolerate procedure well.     ASSESSMENT & PLAN:   Trigger finger, acquired Acutely occurring.  Triggering of the third right flexor tendon.  Does have a fair amount of arthritis at the MCP joints. -Counseled on home exercise therapy and supportive care. - injection today  - counseled on splinting

## 2022-05-19 NOTE — Patient Instructions (Signed)
Nice to meet you Please try the splint at night   Please send me a message in MyChart with any questions or updates.  Please see me back in 4 weeks or as needed if better.   --Dr. Jordan Likes

## 2022-09-25 ENCOUNTER — Encounter: Payer: Self-pay | Admitting: *Deleted

## 2022-10-16 ENCOUNTER — Telehealth: Payer: Self-pay | Admitting: Family Medicine

## 2022-10-16 DIAGNOSIS — I10 Essential (primary) hypertension: Secondary | ICD-10-CM

## 2022-10-16 MED ORDER — LISINOPRIL 10 MG PO TABS: 10.0000 mg | ORAL_TABLET | Freq: Every day | ORAL | 0 refills | Status: DC

## 2022-10-16 NOTE — Telephone Encounter (Signed)
Refill sent. Note that appt needed for further refills

## 2022-10-16 NOTE — Telephone Encounter (Signed)
Prescription Request  10/16/2022  Is this a "Controlled Substance" medicine? No  LOV: 05/15/2022  What is the name of the medication or equipment?   lisinopril (ZESTRIL) 10 MG tablet [[id number]]   Have you contacted your pharmacy to request a refill? No   Which pharmacy would you like this sent to?   [facility] - [place], [place] - One [facility] AT Portal to Registered [facility] Sites [address] [place] [address] Phone: [phone] Fax: [phone]    Patient notified that their request is being sent to the clinical staff for review and that they should receive a response within 2 business days.   Please advise at Mobile [phone] (mobile)

## 2022-10-16 NOTE — Addendum Note (Signed)
Addended bySilvio Pate on: 10/16/2022 09:45 AM   Modules accepted: Orders

## 2022-11-13 NOTE — Progress Notes (Unsigned)
Established Patient Office Visit  Subjective   Patient ID: Natasha Ford, female    DOB: Jul 05, 1928  Age: 89 y.o. MRN: [id number]  CC: routine f/u    HPI  Patient reports she is doing well overall.  She is living at [facility]. No concerns today.  She is here today with her daughter.    HYPERTENSION: - Medications: lisinopril 10 mg daily - Compliance: good - Checking BP at home: not recently since back pain flared up; previously normal - Denies any SOB, recurrent headaches, CP, vision changes, LE edema, dizziness, palpitations, or medication side effects. - Diet: trying for heart healthy, 3 meals/day - Exercise: walking daily, recently started an exercise class  - She has recently been taking oral prednisone and having treatment for back pain, following with specialists    HYPERLIPIDEMIA - medications: none, lifestyle management only  - compliance: n/a - medication SEs: n/a The ASCVD Risk score (Arnett DK, et al., 2019) failed to calculate for the following reasons:   The 2019 ASCVD risk score is only valid for ages 66 to 79   Vitamin D Deficiency/Osteoporosis: - taking supplementation (Calcium-Vitamin D 600 BID; cholecalciferol 1,000 units BID) - no recent falls/fractures/injuries - She stopped Prolia around 2019 after 5 years of treatment. Previously had done 10 years of Fosamax. No recent bone density scan.            ROS All review of systems negative except what is listed in the HPI    Objective:     BP 138/61   Pulse 69   Ht 4\' 10"  (1.473 m)   Wt 92 lb (41.7 kg)   SpO2 100%   BMI 19.23 kg/m    Physical Exam Vitals reviewed.  Constitutional:      General: She is not in acute distress.    Appearance: Normal appearance. She is normal weight. She is not ill-appearing.  Cardiovascular:     Rate and Rhythm: Normal rate and regular rhythm.  Pulmonary:     Effort: Pulmonary effort is normal.     Breath sounds: Normal breath  sounds.  Musculoskeletal:     Right lower leg: No edema.     Left lower leg: No edema.  Skin:    General: Skin is warm and dry.  Neurological:     General: No focal deficit present.     Mental Status: She is alert and oriented to person, place, and time. Mental status is at baseline.  Psychiatric:        Mood and Affect: Mood normal.        Behavior: Behavior normal.        Thought Content: Thought content normal.        Judgment: Judgment normal.      No results found for any visits on 11/14/22.    The ASCVD Risk score (Arnett DK, et al., 2019) failed to calculate for the following reasons:   The 2019 ASCVD risk score is only valid for ages 48 to 42    Assessment & Plan:   Primary hypertension Blood pressure is at goal for age and co-morbidities.  I recommend continuing lisinopril 10 mg.   - BP goal <130/80 - monitor and log blood pressures at home - check around the same time each day in a relaxed setting - Limit salt to <2000 mg/day - Follow DASH eating plan (heart healthy diet) - limit alcohol to 2 standard drinks per day for men and 1 per day for  women - avoid tobacco products - get at least 2 hours of regular aerobic exercise weekly Patient aware of signs/symptoms requiring further/urgent evaluation.    Osteoporosis without current pathological fracture Continue calcium and vitamin D supplementation.  Recommend routine exercise, weight bearing activities.  Safety discussed. No recent falls.  Patient would like another DEXA scan to check status since she has now been off of medication for about 5 years  Mixed hyperlipidemia -Medication management: continue lifestyle measures -Diet low in saturated fat -Regular exercise - at least 30 minutes, 5 times per week -Labs today     Orders Placed This Encounter  Procedures   DG Bone Density    Standing Status:   Future    Standing Expiration Date:   11/14/2023    Order Specific Question:   Reason for Exam  (SYMPTOM  OR DIAGNOSIS REQUIRED)    Answer:   hx of osteoporosis    Order Specific Question:   Preferred imaging location?    Answer:   [facility]   Comprehensive metabolic panel   Lipid panel   Vitamin D (25 hydroxy)     Return in about 6 months (around 05/16/2023) for routine follow-up.    Clayborne Dana, NP

## 2022-11-14 ENCOUNTER — Encounter: Payer: Self-pay | Admitting: Family Medicine

## 2022-11-14 ENCOUNTER — Ambulatory Visit: Payer: 59 | Admitting: Family Medicine

## 2022-11-14 VITALS — BP 138/61 | HR 69 | Ht <= 58 in | Wt 92.0 lb

## 2022-11-14 DIAGNOSIS — I10 Essential (primary) hypertension: Secondary | ICD-10-CM

## 2022-11-14 DIAGNOSIS — M810 Age-related osteoporosis without current pathological fracture: Secondary | ICD-10-CM | POA: Diagnosis not present

## 2022-11-14 DIAGNOSIS — E782 Mixed hyperlipidemia: Secondary | ICD-10-CM | POA: Diagnosis not present

## 2022-11-14 LAB — COMPREHENSIVE METABOLIC PANEL
ALT: 18 U/L (ref 0–35)
AST: 18 U/L (ref 0–37)
Albumin: 4.1 g/dL (ref 3.5–5.2)
Alkaline Phosphatase: 68 U/L (ref 39–117)
BUN: 23 mg/dL (ref 6–23)
CO2: 27 mEq/L (ref 19–32)
Calcium: 10 mg/dL (ref 8.4–10.5)
Chloride: 98 mEq/L (ref 96–112)
Creatinine, Ser: 0.91 mg/dL (ref 0.40–1.20)
GFR: 54.8 mL/min — ABNORMAL LOW (ref >60.00)
Glucose, Bld: 144 mg/dL — ABNORMAL HIGH (ref 70–99)
Potassium: 4.4 mEq/L (ref 3.5–5.1)
Sodium: 137 mEq/L (ref 135–145)
Total Bilirubin: 0.8 mg/dL (ref 0.2–1.2)
Total Protein: 6.9 g/dL (ref 6.0–8.3)

## 2022-11-14 LAB — LIPID PANEL
Cholesterol: 207 mg/dL — ABNORMAL HIGH (ref 0–200)
HDL: 78.3 mg/dL (ref >39.00)
LDL Cholesterol: 105 mg/dL — ABNORMAL HIGH (ref 0–99)
NonHDL: 128.43
Total CHOL/HDL Ratio: 3
Triglycerides: 116 mg/dL (ref 0.0–149.0)
VLDL: 23.2 mg/dL (ref 0.0–40.0)

## 2022-11-14 LAB — VITAMIN D 25 HYDROXY (VIT D DEFICIENCY, FRACTURES): VITD: 60.82 ng/mL (ref 30.00–100.00)

## 2022-11-14 MED ORDER — LISINOPRIL 10 MG PO TABS: 10.0000 mg | ORAL_TABLET | Freq: Every day | ORAL | 1 refills | Status: DC

## 2022-11-14 NOTE — Assessment & Plan Note (Signed)
Blood pressure is at goal for age and co-morbidities.  I recommend continuing lisinopril 10 mg.   - BP goal <130/80 - monitor and log blood pressures at home - check around the same time each day in a relaxed setting - Limit salt to <2000 mg/day - Follow DASH eating plan (heart healthy diet) - limit alcohol to 2 standard drinks per day for men and 1 per day for women - avoid tobacco products - get at least 2 hours of regular aerobic exercise weekly Patient aware of signs/symptoms requiring further/urgent evaluation.

## 2022-11-14 NOTE — Assessment & Plan Note (Signed)
-  Medication management: continue lifestyle measures -Diet low in saturated fat -Regular exercise - at least 30 minutes, 5 times per week -Labs today   

## 2022-11-14 NOTE — Patient Instructions (Addendum)
Good to see you today! Ordering bone density scan - they will call you to schedule Labs today

## 2022-11-14 NOTE — Assessment & Plan Note (Signed)
Continue calcium and vitamin D supplementation.  Recommend routine exercise, weight bearing activities.  Safety discussed. No recent falls.  Patient would like another DEXA scan to check status since she has now been off of medication for about 5 years

## 2022-12-13 ENCOUNTER — Encounter: Payer: Self-pay | Admitting: Family Medicine

## 2023-01-26 ENCOUNTER — Telehealth: Payer: Self-pay | Admitting: Family Medicine

## 2023-01-26 NOTE — Telephone Encounter (Signed)
Pt's daughter called to say that [facility] is booked out until November for bone density scans. [facility] can see her in Sept. Please send bone density order to [facility] at [phone] and call pt to advise so they can schedule.

## 2023-01-29 NOTE — Telephone Encounter (Signed)
Order changed and faxed to [facility] with confirmation received. Called patient's daughter and LVM letting her know order sent. To call with questions.

## 2023-01-29 NOTE — Telephone Encounter (Signed)
Pt's daughter called back to follow up. Please call & advise.

## 2023-01-30 NOTE — Telephone Encounter (Signed)
Natasha Ford called back & stated she spoke with [facility] & they informed her they did not receive the fax from our office. Please send to Fax # [phone] & advise Natasha Ford.

## 2023-01-30 NOTE — Telephone Encounter (Signed)
Patients daughter called back and stated that imaging did find referral.

## 2023-02-20 LAB — HM DEXA SCAN

## 2023-02-21 ENCOUNTER — Encounter: Payer: Self-pay | Admitting: Family Medicine

## 2023-03-07 ENCOUNTER — Telehealth: Payer: Self-pay | Admitting: Family Medicine

## 2023-03-07 MED ORDER — ALENDRONATE SODIUM 70 MG PO TABS: 70.0000 mg | ORAL_TABLET | ORAL | 3 refills | Status: DC

## 2023-03-07 NOTE — Telephone Encounter (Signed)
Spoke with patient, let her know per Ladona Ridgel her bone density has gotten worse and she recommends restarting Fosamax. Patient okay with that. She would like sent to mail order pharmacy. RX sent. Okay per Ladona Ridgel.

## 2023-03-07 NOTE — Addendum Note (Signed)
Addended bySilvio Pate on: 03/07/2023 04:07 PM   Modules accepted: Orders

## 2023-03-07 NOTE — Telephone Encounter (Signed)
Pt called & requested to speak to [facility] about her recent bone density scan at [facility] at [facility]. Please call & advise.

## 2023-05-16 ENCOUNTER — Ambulatory Visit: Payer: 59 | Admitting: Family Medicine

## 2023-05-16 ENCOUNTER — Encounter: Payer: Self-pay | Admitting: Family Medicine

## 2023-05-16 VITALS — BP 147/73 | HR 94 | Ht <= 58 in | Wt 98.0 lb

## 2023-05-16 DIAGNOSIS — M810 Age-related osteoporosis without current pathological fracture: Secondary | ICD-10-CM | POA: Diagnosis not present

## 2023-05-16 DIAGNOSIS — E782 Mixed hyperlipidemia: Secondary | ICD-10-CM | POA: Diagnosis not present

## 2023-05-16 DIAGNOSIS — I10 Essential (primary) hypertension: Secondary | ICD-10-CM

## 2023-05-16 LAB — CBC WITH DIFFERENTIAL/PLATELET
Basophils Absolute: 0 10*3/uL (ref 0.0–0.1)
Basophils Relative: 0.6 % (ref 0.0–3.0)
Eosinophils Absolute: 0.1 10*3/uL (ref 0.0–0.7)
Eosinophils Relative: 1.1 % (ref 0.0–5.0)
HCT: 44.5 % (ref 36.0–46.0)
Hemoglobin: 14.7 g/dL (ref 12.0–15.0)
Lymphocytes Relative: 23.4 % (ref 12.0–46.0)
Lymphs Abs: 1.6 10*3/uL (ref 0.7–4.0)
MCHC: 33.1 g/dL (ref 30.0–36.0)
MCV: 96.3 fL (ref 78.0–100.0)
Monocytes Absolute: 0.6 10*3/uL (ref 0.1–1.0)
Monocytes Relative: 9.1 % (ref 3.0–12.0)
Neutro Abs: 4.4 10*3/uL (ref 1.4–7.7)
Neutrophils Relative %: 65.8 % (ref 43.0–77.0)
Platelets: 252 10*3/uL (ref 150.0–400.0)
RBC: 4.62 Mil/uL (ref 3.87–5.11)
RDW: 15.3 % (ref 11.5–15.5)
WBC: 6.7 10*3/uL (ref 4.0–10.5)

## 2023-05-16 LAB — COMPREHENSIVE METABOLIC PANEL
ALT: 14 U/L (ref 0–35)
AST: 19 U/L (ref 0–37)
Albumin: 4.1 g/dL (ref 3.5–5.2)
Alkaline Phosphatase: 88 U/L (ref 39–117)
BUN: 25 mg/dL — ABNORMAL HIGH (ref 6–23)
CO2: 28 meq/L (ref 19–32)
Calcium: 9.9 mg/dL (ref 8.4–10.5)
Chloride: 103 meq/L (ref 96–112)
Creatinine, Ser: 0.8 mg/dL (ref 0.40–1.20)
GFR: 63.74 mL/min (ref >60.00)
Glucose, Bld: 101 mg/dL — ABNORMAL HIGH (ref 70–99)
Potassium: 4.3 meq/L (ref 3.5–5.1)
Sodium: 140 meq/L (ref 135–145)
Total Bilirubin: 1 mg/dL (ref 0.2–1.2)
Total Protein: 7 g/dL (ref 6.0–8.3)

## 2023-05-16 LAB — VITAMIN D 25 HYDROXY (VIT D DEFICIENCY, FRACTURES): VITD: 69.48 ng/mL (ref 30.00–100.00)

## 2023-05-16 NOTE — Assessment & Plan Note (Signed)
Continue Fosamax. Continue calcium and vitamin D supplementation.  Recommend routine exercise, weight bearing activities.  Safety discussed. No recent falls.

## 2023-05-16 NOTE — Assessment & Plan Note (Signed)
-  Medication management: continue lifestyle measures -Regular exercise - at least 30 minutes, 5 times per week Lifestyle factors for lowering cholesterol include: Diet therapy - heart-healthy diet rich in fruits, veggies, fiber-rich whole grains, lean meats, chicken, fish (at least twice a week), fat-free or 1% dairy products; foods low in saturated/trans fats, cholesterol, sodium, and sugar. Mediterranean diet has shown to be very heart healthy. Regular exercise - recommend at least 30 minutes a day, 5 times per week Weight management

## 2023-05-16 NOTE — Assessment & Plan Note (Signed)
Blood pressure is not at goal for age and co-morbidities.  I recommend continuing lisinopril 10 mg and monitoring at home.  - BP goal <130/80 - monitor and log blood pressures at home - check around the same time each day in a relaxed setting - Limit salt to <2000 mg/day - Follow DASH eating plan (heart healthy diet) - limit alcohol to 2 standard drinks per day for men and 1 per day for women - avoid tobacco products - get at least 2 hours of regular aerobic exercise weekly Patient aware of signs/symptoms requiring further/urgent evaluation. Nurse visit in 2-4 weeks to recheck BP.

## 2023-05-16 NOTE — Progress Notes (Signed)
Established Patient Office Visit  Subjective   Patient ID: Natasha Ford, female    DOB: 09-08-1928  Age: 89 y.o. MRN: [id number]  CC: routine f/u    HPI  Patient reports she is doing well overall.  She is living at [facility]. No concerns today.    HYPERTENSION: - Medications: lisinopril 10 mg daily - Compliance: good - Checking BP at home: not recently since back pain flared up; previously normal - Denies any SOB, recurrent headaches, CP, vision changes, LE edema, dizziness, palpitations, or medication side effects. - Diet: trying for heart healthy, 3 meals/day - Exercise: walking daily, recently started an exercise class  - She has recently been taking oral prednisone and having treatment for back pain, following with specialists    HYPERLIPIDEMIA - medications: none, lifestyle management only  - compliance: n/a - medication SEs: n/a The ASCVD Risk score (Arnett DK, et al., 2019) failed to calculate for the following reasons:   The 2019 ASCVD risk score is only valid for ages 31 to 2   Vitamin D Deficiency/Osteoporosis: - taking supplementation (Calcium-Vitamin D 600 BID; cholecalciferol 1,000 units BID) - no recent falls/fractures/injuries - She stopped Prolia around 2019 after 5 years of treatment. Previously had done 10 years of Fosamax.  - Bone density 2024 slightly worse, patient opted to restart Fosamax.              ROS All review of systems negative except what is listed in the HPI    Objective:     BP (!) 147/73   Pulse 94   Ht 4\' 10"  (1.473 m)   Wt 98 lb (44.5 kg)   SpO2 95%   BMI 20.48 kg/m    Physical Exam Vitals reviewed.  Constitutional:      General: She is not in acute distress.    Appearance: Normal appearance. She is normal weight. She is not ill-appearing.  Cardiovascular:     Rate and Rhythm: Normal rate and regular rhythm.  Pulmonary:     Effort: Pulmonary effort is normal.     Breath sounds:  Normal breath sounds.  Musculoskeletal:     Right lower leg: No edema.     Left lower leg: No edema.  Skin:    General: Skin is warm and dry.  Neurological:     General: No focal deficit present.     Mental Status: She is alert and oriented to person, place, and time. Mental status is at baseline.  Psychiatric:        Mood and Affect: Mood normal.        Behavior: Behavior normal.        Thought Content: Thought content normal.        Judgment: Judgment normal.      No results found for any visits on 05/16/23.    The ASCVD Risk score (Arnett DK, et al., 2019) failed to calculate for the following reasons:   The 2019 ASCVD risk score is only valid for ages 45 to 90    Assessment & Plan:   Problem List Items Addressed This Visit       Active Problems   Primary hypertension - Primary (Chronic)    Blood pressure is not at goal for age and co-morbidities.  I recommend continuing lisinopril 10 mg and monitoring at home.  - BP goal <130/80 - monitor and log blood pressures at home - check around the same time each day in a relaxed setting - Limit  salt to <2000 mg/day - Follow DASH eating plan (heart healthy diet) - limit alcohol to 2 standard drinks per day for men and 1 per day for women - avoid tobacco products - get at least 2 hours of regular aerobic exercise weekly Patient aware of signs/symptoms requiring further/urgent evaluation. Nurse visit in 2-4 weeks to recheck BP.        Relevant Orders   Comprehensive metabolic panel   CBC with Differential/Platelet   Mixed hyperlipidemia (Chronic)    -Medication management: continue lifestyle measures -Regular exercise - at least 30 minutes, 5 times per week Lifestyle factors for lowering cholesterol include: Diet therapy - heart-healthy diet rich in fruits, veggies, fiber-rich whole grains, lean meats, chicken, fish (at least twice a week), fat-free or 1% dairy products; foods low in saturated/trans fats, cholesterol,  sodium, and sugar. Mediterranean diet has shown to be very heart healthy. Regular exercise - recommend at least 30 minutes a day, 5 times per week Weight management        Osteoporosis without current pathological fracture (Chronic)    Continue Fosamax. Continue calcium and vitamin D supplementation.  Recommend routine exercise, weight bearing activities.  Safety discussed. No recent falls.        Relevant Orders   Comprehensive metabolic panel   VITAMIN D 25 Hydroxy (Vit-D Deficiency, Fractures)       Orders Placed This Encounter  Procedures   Comprehensive metabolic panel   VITAMIN D 25 Hydroxy (Vit-D Deficiency, Fractures)   CBC with Differential/Platelet     Return in about 6 months (around 11/14/2023) for routine follow-up; 1 month BP follow-up with nurse.    Clayborne Dana, NP

## 2023-06-19 ENCOUNTER — Ambulatory Visit: Payer: 59

## 2023-06-20 ENCOUNTER — Ambulatory Visit: Payer: 59 | Admitting: Family Medicine

## 2023-06-20 VITALS — BP 163/87 | HR 76

## 2023-06-20 DIAGNOSIS — I10 Essential (primary) hypertension: Secondary | ICD-10-CM | POA: Diagnosis not present

## 2023-06-20 MED ORDER — AMLODIPINE BESYLATE 2.5 MG PO TABS: 2.5000 mg | ORAL_TABLET | Freq: Every morning | ORAL | 0 refills | Status: DC

## 2023-06-20 MED ORDER — LISINOPRIL 10 MG PO TABS: 10.0000 mg | ORAL_TABLET | Freq: Every evening | ORAL | 1 refills | Status: DC

## 2023-06-20 NOTE — Progress Notes (Signed)
 Pt here for Blood pressure check per Waddell Mon, NP.  Pt currently takes: lisinopril  10mg    Pt reports compliance with medication.  Pt has been taking blood pressure at home.   05/17/23 120/76    05/23/23 129/77 05/25/23 117/72 05/30/23 125/80 06/09/23 122/77 06/10/23 125/79 06/15/23 134/72  Patient has not taken medication yet today.  She usually take before going to bed.  BP Readings from Last 3 Encounters:  05/16/23 (!) 147/73  11/14/22 138/61  05/19/22 118/76     BP today  839am right arm 185/84  HR 73 844am  left arm 180/82  HR 67  BP recheck 848am right arm 169/83  HR 68 851am left arm 163/87  HR 76  Pt advised per Waddell to add amlodipine  2.5mg  to take in the morning and take the lisinopril  in the evening.  Make an appointment for follow up in one month and bring blood pressure machine.  Appointment made for patient for 07/23/23 and she will bring blood pressure machine.

## 2023-07-23 ENCOUNTER — Ambulatory Visit: Payer: 59 | Admitting: Family Medicine

## 2023-07-23 ENCOUNTER — Encounter: Payer: Self-pay | Admitting: Family Medicine

## 2023-07-23 VITALS — BP 152/71 | HR 77 | Ht <= 58 in | Wt 98.0 lb

## 2023-07-23 DIAGNOSIS — I10 Essential (primary) hypertension: Secondary | ICD-10-CM | POA: Diagnosis not present

## 2023-07-23 MED ORDER — AMLODIPINE BESYLATE 5 MG PO TABS: 5.0000 mg | ORAL_TABLET | Freq: Every day | ORAL | 3 refills | Status: DC

## 2023-07-23 NOTE — Progress Notes (Signed)
 Established Patient Office Visit  Subjective   Patient ID: Natasha Ford, female    DOB: Jan 19, 1929  Age: 89 y.o. MRN: [id number]  Chief Complaint  Patient presents with   Medical Management of Chronic Issues   Hypertension   Patient is here for blood pressure follow-up. At recent nurse visit, we added amlodipine  2.5 mg daily to regimen.    Hypertension: - Medications: lisinopril  10 mg daily, amlodipine  2.5 mg daily  - Compliance: good - Checking BP at home: not regularly - Denies any SOB, recurrent headaches, CP, vision changes, LE edema, dizziness, palpitations, or medication side effects. - Diet: heart healthy  - Exercise: walking, exercise class     BP Readings from Last 3 Encounters:  07/23/23 (!) 152/71  06/20/23 (!) 163/87  05/16/23 (!) 147/73        ROS All review of systems negative except what is listed in the HPI    Objective:     BP (!) 152/71   Pulse 77   Ht 4\' 10"  (1.473 m)   Wt 98 lb (44.5 kg)   SpO2 98%   BMI 20.48 kg/m    Physical Exam Vitals reviewed.  Constitutional:      General: She is not in acute distress.    Appearance: Normal appearance. She is normal weight. She is not ill-appearing.  Cardiovascular:     Rate and Rhythm: Normal rate and regular rhythm.  Pulmonary:     Effort: Pulmonary effort is normal.     Breath sounds: Normal breath sounds.  Musculoskeletal:     Right lower leg: No edema.     Left lower leg: No edema.  Skin:    General: Skin is warm and dry.  Neurological:     General: No focal deficit present.     Mental Status: She is alert and oriented to person, place, and time. Mental status is at baseline.  Psychiatric:        Mood and Affect: Mood normal.        Behavior: Behavior normal.        Thought Content: Thought content normal.        Judgment: Judgment normal.         No results found for any visits on 07/23/23.    The ASCVD Risk score (Arnett DK, et al., 2019) failed to calculate for  the following reasons:   The 2019 ASCVD risk score is only valid for ages 71 to 35    Assessment & Plan:   Problem List Items Addressed This Visit       Active Problems   Primary hypertension - Primary (Chronic)   Blood pressure is not at goal for age and co-morbidities.   Recommendations: continue lisinopril  10 mg daily, increase to amlodipine  5 mg daily  - BP goal <130/80 - monitor and log blood pressures at home - check around the same time each day in a relaxed setting - Limit salt to <2000 mg/day - Follow DASH eating plan (heart healthy diet) - limit alcohol to 2 standard drinks per day for men and 1 per day for women - avoid tobacco products - get at least 2 hours of regular aerobic exercise weekly Patient aware of signs/symptoms requiring further/urgent evaluation. Nurse visit BP check in 2-4 weeks      Relevant Medications   amLODipine  (NORVASC ) 5 MG tablet    Return in about 2 weeks (around 08/06/2023) for BP check with nurse.    Everlina Hock, NP

## 2023-07-23 NOTE — Assessment & Plan Note (Signed)
 Blood pressure is not at goal for age and co-morbidities.   Recommendations: continue lisinopril  10 mg daily, increase to amlodipine  5 mg daily  - BP goal <130/80 - monitor and log blood pressures at home - check around the same time each day in a relaxed setting - Limit salt to <2000 mg/day - Follow DASH eating plan (heart healthy diet) - limit alcohol to 2 standard drinks per day for men and 1 per day for women - avoid tobacco products - get at least 2 hours of regular aerobic exercise weekly Patient aware of signs/symptoms requiring further/urgent evaluation. Nurse visit BP check in 2-4 weeks

## 2023-08-01 ENCOUNTER — Other Ambulatory Visit: Payer: 59

## 2023-08-07 ENCOUNTER — Ambulatory Visit (INDEPENDENT_AMBULATORY_CARE_PROVIDER_SITE_OTHER): Payer: 59 | Admitting: Family Medicine

## 2023-08-07 VITALS — BP 122/62 | HR 69

## 2023-08-07 DIAGNOSIS — I10 Essential (primary) hypertension: Secondary | ICD-10-CM | POA: Diagnosis not present

## 2023-08-07 NOTE — Progress Notes (Signed)
 Pt here for Blood pressure check per Hyman Hopes, NP  Pt currently takes: Amlodipine 5 mg, Lisinopril 10 mg  Has taken amlodipine this morning.  Has been taking blood pressures at home.  Pt reports compliance with medication.  BP Readings from Last 3 Encounters:  07/23/23 (!) 152/71  06/20/23 (!) 163/87  05/16/23 (!) 147/73     BP today @ = 122/62 HR = 69  Pt advised per Ladona Ridgel blood pressure look good today and the ones from home.  Continue same medications and keep follow up in June

## 2023-09-11 ENCOUNTER — Other Ambulatory Visit: Payer: Self-pay | Admitting: Family Medicine

## 2023-09-11 DIAGNOSIS — I10 Essential (primary) hypertension: Secondary | ICD-10-CM

## 2023-09-11 NOTE — Telephone Encounter (Signed)
 Copied from CRM [id number]. Topic: Clinical - Medication Refill >> Sep 11, 2023  8:36 AM Mackie Pai E wrote: Most Recent Primary Care Visit:  Provider: Clayborne Dana  Department: [facility]  Visit Type: CLINICAL SUPPORT  Date: 08/07/2023  Medication: amLODipine (NORVASC) 5 MG tablet  Has the patient contacted their pharmacy? No (Agent: If no, request that the patient contact the pharmacy for the refill. If patient does not wish to contact the pharmacy document the reason why and proceed with request.) (Agent: If yes, when and what did the pharmacy advise?)  Is this the correct pharmacy for this prescription? Yes If no, delete pharmacy and type the correct one.  This is the patient's preferred pharmacy:   [facility] [employer], [place] - [address] Portal to [facility] [employer] [address] [place] [address] Phone: [phone] Fax: [phone]   Has the prescription been filled recently? No  Is the patient out of the medication? No, patient has about 10 pills left.  Has the patient been seen for an appointment in the last year OR does the patient have an upcoming appointment? Yes  Can we respond through MyChart? Yes  Agent: Please be advised that Rx refills may take up to 3 business days. We ask that you follow-up with your pharmacy.

## 2023-09-26 ENCOUNTER — Other Ambulatory Visit: Payer: Self-pay | Admitting: Neurology

## 2023-09-26 DIAGNOSIS — I10 Essential (primary) hypertension: Secondary | ICD-10-CM

## 2023-09-26 MED ORDER — AMLODIPINE BESYLATE 5 MG PO TABS: 5.0000 mg | ORAL_TABLET | Freq: Every day | ORAL | 1 refills | Status: DC

## 2023-11-13 NOTE — Progress Notes (Unsigned)
   Established Patient Office Visit  Subjective   Patient ID: Natasha Ford, female    DOB: 09/19/1928  Age: 89 y.o. MRN: [id number]  CC: routine f/u    HPI  Patient reports she is doing well overall.  She is living at [facility]. No concerns today.     HYPERTENSION: - Medications: lisinopril  10 mg daily, amlodipine  5 mg daily - Compliance: good - Checking BP at home: not recently since back pain flared up; previously normal - Denies any SOB, recurrent headaches, CP, vision changes, LE edema, dizziness, palpitations, or medication side effects. - Diet: trying for heart healthy - Exercise: walking daily, recently started an exercise class  ***   HYPERLIPIDEMIA: - medications: none, lifestyle management only  - compliance: n/a - medication SEs: n/a The ASCVD Risk score (Arnett DK, et al., 2019) failed to calculate for the following reasons:   The 2019 ASCVD risk score is only valid for ages 71 to 30   Vitamin D  Deficiency/Osteoporosis: - taking supplementation (Calcium-Vitamin D  600 BID; cholecalciferol 1,000 units BID) - no recent falls/fractures/injuries - She stopped Prolia around 2019 after 5 years of treatment. Previously had done 10 years of Fosamax .  - Bone density 2024 slightly worse, patient opted to restart Fosamax .              ROS All review of systems negative except what is listed in the HPI    Objective:     There were no vitals taken for this visit.   Physical Exam Vitals reviewed.  Constitutional:      General: She is not in acute distress.    Appearance: Normal appearance. She is normal weight. She is not ill-appearing.  Cardiovascular:     Rate and Rhythm: Normal rate and regular rhythm.  Pulmonary:     Effort: Pulmonary effort is normal.     Breath sounds: Normal breath sounds.  Musculoskeletal:     Right lower leg: No edema.     Left lower leg: No edema.  Skin:    General: Skin is warm and dry.   Neurological:     General: No focal deficit present.     Mental Status: She is alert and oriented to person, place, and time. Mental status is at baseline.  Psychiatric:        Mood and Affect: Mood normal.        Behavior: Behavior normal.        Thought Content: Thought content normal.        Judgment: Judgment normal.      No results found for any visits on 11/14/23.    The ASCVD Risk score (Arnett DK, et al., 2019) failed to calculate for the following reasons:   The 2019 ASCVD risk score is only valid for ages 109 to 9    Assessment & Plan:   Problem List Items Addressed This Visit       Active Problems   Primary hypertension - Primary (Chronic)   Mixed hyperlipidemia (Chronic)       No orders of the defined types were placed in this encounter.    No follow-ups on file.    Everlina Hock, NP

## 2023-11-14 ENCOUNTER — Ambulatory Visit: Payer: Self-pay | Admitting: Family Medicine

## 2023-11-14 ENCOUNTER — Encounter: Payer: Self-pay | Admitting: Family Medicine

## 2023-11-14 ENCOUNTER — Ambulatory Visit: Payer: 59 | Admitting: Family Medicine

## 2023-11-14 VITALS — BP 133/82 | HR 84 | Ht <= 58 in | Wt 96.0 lb

## 2023-11-14 DIAGNOSIS — M810 Age-related osteoporosis without current pathological fracture: Secondary | ICD-10-CM

## 2023-11-14 DIAGNOSIS — I10 Essential (primary) hypertension: Secondary | ICD-10-CM | POA: Diagnosis not present

## 2023-11-14 DIAGNOSIS — E782 Mixed hyperlipidemia: Secondary | ICD-10-CM | POA: Diagnosis not present

## 2023-11-14 LAB — CBC WITH DIFFERENTIAL/PLATELET
Basophils Absolute: 0 10*3/uL (ref 0.0–0.1)
Basophils Relative: 0.8 % (ref 0.0–3.0)
Eosinophils Absolute: 0.1 10*3/uL (ref 0.0–0.7)
Eosinophils Relative: 1.3 % (ref 0.0–5.0)
HCT: 44.7 % (ref 36.0–46.0)
Hemoglobin: 14.8 g/dL (ref 12.0–15.0)
Lymphocytes Relative: 21.2 % (ref 12.0–46.0)
Lymphs Abs: 1.3 10*3/uL (ref 0.7–4.0)
MCHC: 33.1 g/dL (ref 30.0–36.0)
MCV: 94.9 fl (ref 78.0–100.0)
Monocytes Absolute: 0.6 10*3/uL (ref 0.1–1.0)
Monocytes Relative: 9.5 % (ref 3.0–12.0)
Neutro Abs: 4 10*3/uL (ref 1.4–7.7)
Neutrophils Relative %: 67.2 % (ref 43.0–77.0)
Platelets: 241 10*3/uL (ref 150.0–400.0)
RBC: 4.71 Mil/uL (ref 3.87–5.11)
RDW: 16.2 % — ABNORMAL HIGH (ref 11.5–15.5)
WBC: 6 10*3/uL (ref 4.0–10.5)

## 2023-11-14 LAB — LIPID PANEL
Cholesterol: 230 mg/dL — ABNORMAL HIGH (ref 0–200)
HDL: 98.3 mg/dL (ref >39.00)
LDL Cholesterol: 115 mg/dL — ABNORMAL HIGH (ref 0–99)
NonHDL: 131.84
Total CHOL/HDL Ratio: 2
Triglycerides: 85 mg/dL (ref 0.0–149.0)
VLDL: 17 mg/dL (ref 0.0–40.0)

## 2023-11-14 LAB — COMPREHENSIVE METABOLIC PANEL WITH GFR
ALT: 16 U/L (ref 0–35)
AST: 21 U/L (ref 0–37)
Albumin: 4.4 g/dL (ref 3.5–5.2)
Alkaline Phosphatase: 74 U/L (ref 39–117)
BUN: 21 mg/dL (ref 6–23)
CO2: 27 meq/L (ref 19–32)
Calcium: 10.1 mg/dL (ref 8.4–10.5)
Chloride: 101 meq/L (ref 96–112)
Creatinine, Ser: 0.86 mg/dL (ref 0.40–1.20)
GFR: 58.24 mL/min — ABNORMAL LOW (ref >60.00)
Glucose, Bld: 116 mg/dL — ABNORMAL HIGH (ref 70–99)
Potassium: 3.7 meq/L (ref 3.5–5.1)
Sodium: 139 meq/L (ref 135–145)
Total Bilirubin: 0.9 mg/dL (ref 0.2–1.2)
Total Protein: 7.2 g/dL (ref 6.0–8.3)

## 2023-11-14 MED ORDER — LISINOPRIL 10 MG PO TABS: 10.0000 mg | ORAL_TABLET | Freq: Every evening | ORAL | 1 refills | Status: DC

## 2023-11-14 MED ORDER — AMLODIPINE BESYLATE 5 MG PO TABS: 5.0000 mg | ORAL_TABLET | Freq: Every day | ORAL | 1 refills | Status: DC

## 2023-11-14 NOTE — Assessment & Plan Note (Signed)
-  Medication management: continue lifestyle measures -Regular exercise - at least 30 minutes, 5 times per week Lifestyle factors for lowering cholesterol include: Diet therapy - heart-healthy diet rich in fruits, veggies, fiber-rich whole grains, lean meats, chicken, fish (at least twice a week), fat-free or 1% dairy products; foods low in saturated/trans fats, cholesterol, sodium, and sugar. Mediterranean diet has shown to be very heart healthy. Regular exercise - recommend at least 30 minutes a day, 5 times per week Weight management

## 2023-11-14 NOTE — Assessment & Plan Note (Signed)
 Continue Fosamax. Continue calcium and vitamin D supplementation.  Recommend routine exercise, weight bearing activities.  Safety discussed. No recent falls.

## 2023-11-14 NOTE — Assessment & Plan Note (Signed)
 Blood pressure is not at goal for age and co-morbidities.   Recommendations: continue lisinopril  10 mg daily and amlodipine  5 mg daily  - BP goal <130/80 - monitor and log blood pressures at home - check around the same time each day in a relaxed setting - Limit salt to <2000 mg/day - Follow DASH eating plan (heart healthy diet) - limit alcohol to 2 standard drinks per day for men and 1 per day for women - avoid tobacco products - get at least 2 hours of regular aerobic exercise weekly Patient aware of signs/symptoms requiring further/urgent evaluation. Labs today

## 2023-11-23 ENCOUNTER — Other Ambulatory Visit: Payer: Self-pay | Admitting: Family Medicine

## 2023-11-23 DIAGNOSIS — I10 Essential (primary) hypertension: Secondary | ICD-10-CM

## 2023-11-23 NOTE — Telephone Encounter (Signed)
 Copied from CRM [id number]. Topic: Clinical - Medication Refill >> Nov 23, 2023  9:50 AM Marlan Silva wrote: Medication:  lisinopril  (ZESTRIL ) 10 MG tablet amLODipine  (NORVASC ) 5 MG tablet   Has the patient contacted their pharmacy? Yes (Agent: If no, request that the patient contact the pharmacy for the refill. If patient does not wish to contact the pharmacy document the reason why and proceed with request.) (Agent: If yes, when and what did the pharmacy advise?)  This is the patient's preferred pharmacy:   [facility] - [place], [place] - [address] AT [facility] [employer] [facility] [address] [place] [address] Phone: [phone] Fax: [phone]  Is this the correct pharmacy for this prescription? Yes If no, delete pharmacy and type the correct one.   Has the prescription been filled recently? Yes  Is the patient out of the medication? Yes  Has the patient been seen for an appointment in the last year OR does the patient have an upcoming appointment? Yes  Can we respond through MyChart? Yes  Agent: Please be advised that Rx refills may take up to 3 business days. We ask that you follow-up with your pharmacy.

## 2023-11-23 NOTE — Telephone Encounter (Signed)
 Called patient, these were sent a week ago. They have not contacted pharmacy. Gave them the phone number to [employer] to see what the issue is, she will call back if needed.

## 2023-12-12 ENCOUNTER — Other Ambulatory Visit: Payer: Self-pay | Admitting: Family Medicine

## 2024-02-15 ENCOUNTER — Other Ambulatory Visit: Payer: Self-pay | Admitting: Family Medicine

## 2024-05-21 ENCOUNTER — Ambulatory Visit: Admitting: Family Medicine

## 2024-05-21 ENCOUNTER — Encounter: Payer: Self-pay | Admitting: Family Medicine

## 2024-05-21 VITALS — BP 147/61 | HR 93 | Ht <= 58 in | Wt 98.0 lb

## 2024-05-21 DIAGNOSIS — Z0000 Encounter for general adult medical examination without abnormal findings: Secondary | ICD-10-CM

## 2024-05-21 DIAGNOSIS — M810 Age-related osteoporosis without current pathological fracture: Secondary | ICD-10-CM

## 2024-05-21 DIAGNOSIS — I10 Essential (primary) hypertension: Secondary | ICD-10-CM

## 2024-05-21 DIAGNOSIS — E782 Mixed hyperlipidemia: Secondary | ICD-10-CM

## 2024-05-21 LAB — LIPID PANEL
Cholesterol: 243 mg/dL — ABNORMAL HIGH (ref 0–200)
HDL: 99.4 mg/dL (ref >39.00)
LDL Cholesterol: 119 mg/dL — ABNORMAL HIGH (ref 0–99)
NonHDL: 143.28
Total CHOL/HDL Ratio: 2
Triglycerides: 122 mg/dL (ref 0.0–149.0)
VLDL: 24.4 mg/dL (ref 0.0–40.0)

## 2024-05-21 LAB — COMPREHENSIVE METABOLIC PANEL WITH GFR
ALT: 17 U/L (ref 0–35)
AST: 23 U/L (ref 0–37)
Albumin: 4.6 g/dL (ref 3.5–5.2)
Alkaline Phosphatase: 78 U/L (ref 39–117)
BUN: 21 mg/dL (ref 6–23)
CO2: 25 meq/L (ref 19–32)
Calcium: 9.6 mg/dL (ref 8.4–10.5)
Chloride: 103 meq/L (ref 96–112)
Creatinine, Ser: 0.66 mg/dL (ref 0.40–1.20)
GFR: 75.35 mL/min (ref >60.00)
Glucose, Bld: 107 mg/dL — ABNORMAL HIGH (ref 70–99)
Potassium: 4.2 meq/L (ref 3.5–5.1)
Sodium: 139 meq/L (ref 135–145)
Total Bilirubin: 0.9 mg/dL (ref 0.2–1.2)
Total Protein: 7.3 g/dL (ref 6.0–8.3)

## 2024-05-21 LAB — CBC WITH DIFFERENTIAL/PLATELET
Basophils Absolute: 0.1 K/uL (ref 0.0–0.1)
Basophils Relative: 1 % (ref 0.0–3.0)
Eosinophils Absolute: 0.1 K/uL (ref 0.0–0.7)
Eosinophils Relative: 1.1 % (ref 0.0–5.0)
HCT: 45.4 % (ref 36.0–46.0)
Hemoglobin: 15.1 g/dL — ABNORMAL HIGH (ref 12.0–15.0)
Lymphocytes Relative: 23.3 % (ref 12.0–46.0)
Lymphs Abs: 1.3 K/uL (ref 0.7–4.0)
MCHC: 33.3 g/dL (ref 30.0–36.0)
MCV: 96 fl (ref 78.0–100.0)
Monocytes Absolute: 0.5 K/uL (ref 0.1–1.0)
Monocytes Relative: 9.7 % (ref 3.0–12.0)
Neutro Abs: 3.7 K/uL (ref 1.4–7.7)
Neutrophils Relative %: 64.9 % (ref 43.0–77.0)
Platelets: 220 K/uL (ref 150.0–400.0)
RBC: 4.73 Mil/uL (ref 3.87–5.11)
RDW: 16.2 % — ABNORMAL HIGH (ref 11.5–15.5)
WBC: 5.7 K/uL (ref 4.0–10.5)

## 2024-05-21 LAB — TSH: TSH: 1.21 u[IU]/mL (ref 0.35–5.50)

## 2024-05-21 MED ORDER — ALENDRONATE SODIUM 70 MG PO TABS: 70.0000 mg | ORAL_TABLET | ORAL | 3 refills | Status: AC

## 2024-05-21 MED ORDER — AMLODIPINE BESYLATE 5 MG PO TABS: 5.0000 mg | ORAL_TABLET | Freq: Every day | ORAL | 1 refills | Status: AC

## 2024-05-21 MED ORDER — LISINOPRIL 10 MG PO TABS: 10.0000 mg | ORAL_TABLET | Freq: Every evening | ORAL | 1 refills | Status: AC

## 2024-05-21 NOTE — Progress Notes (Signed)
 Home Blood Pressure Readings  Date BP readings  05/15/2024 120/55  04/22/2024 115/61  04/19/2024 119/79  04/08/2024 118/67  04/07/2024 121/66  04/05/2024 108/66  03/27/2024 121/70  03/22/2024 118/81  03/11/2024 120/72  03/03/2024 114/74  02/29/2024 114/71  02/28/2024 116/67  02/27/2024 115/70

## 2024-05-21 NOTE — Assessment & Plan Note (Signed)
 Blood pressure is not at goal for age and co-morbidities, but home readings are excellent; consider white coat HTN component. Recommendations: continue lisinopril  10 mg daily and amlodipine  5 mg daily  - BP goal <130/80 - monitor and log blood pressures at home - check around the same time each day in a relaxed setting - Limit salt to <2000 mg/day - Follow DASH eating plan (heart healthy diet) - limit alcohol to 2 standard drinks per day for men and 1 per day for women - avoid tobacco products - get at least 2 hours of regular aerobic exercise weekly Patient aware of signs/symptoms requiring further/urgent evaluation. Labs today

## 2024-05-21 NOTE — Assessment & Plan Note (Signed)
-  Medication management: continue lifestyle measures -Regular exercise - at least 30 minutes, 5 times per week Lifestyle factors for lowering cholesterol include: Diet therapy - heart-healthy diet rich in fruits, veggies, fiber-rich whole grains, lean meats, chicken, fish (at least twice a week), fat-free or 1% dairy products; foods low in saturated/trans fats, cholesterol, sodium, and sugar. Mediterranean diet has shown to be very heart healthy. Regular exercise - recommend at least 30 minutes a day, 5 times per week Weight management

## 2024-05-21 NOTE — Progress Notes (Signed)
 Complete physical exam  Patient: Natasha Ford   DOB: 09/26/1928   89 y.o. Female  MRN: [id number]  Subjective:    Chief Complaint  Patient presents with   Annual Exam    Natasha Ford is a 89 y.o. female who presents today for a complete physical exam. She reports consuming a general diet. She tries to stay active.  She generally feels well. She reports sleeping well. She does not have additional problems to discuss today.   Currently lives with: alone Acute concerns or interim problems since last visit: no  Chronic Problems   Hypertension: - Medications: lisinopril  10 mg daily, amlodipine  5 mg daily - Compliance: good - Checking BP at home: excellent - Denies any SOB, recurrent headaches, CP, vision changes, LE edema, dizziness, palpitations, or medication side effects. - Diet: trying for heart healthy - Exercise: walking      Hyperlipidemia: - medications: none, lifestyle management only  - compliance: n/a - medication SEs: n/a The ASCVD Risk score (Arnett DK, et al., 2019) failed to calculate for the following reasons:   The 2019 ASCVD risk score is only valid for ages 56 to 30   * - Cholesterol units were assumed     Vision concerns: no Dental concerns: no      Most recent fall risk assessment:    05/21/2024    9:49 AM  Fall Risk   Falls in the past year? 0  Number falls in past yr: 0  Injury with Fall? 0  Risk for fall due to : No Fall Risks  Follow up Falls evaluation completed     Most recent depression screenings:    05/21/2024    9:49 AM 05/15/2022    8:11 AM  PHQ 2/9 Scores  PHQ - 2 Score 0 0  PHQ- 9 Score 0             Patient Care Team: Almarie Waddell NOVAK, NP as PCP - General (Family Medicine)   Outpatient Medications Prior to Visit  Medication Sig   Ascorbic Acid (VITAMIN C) 1000 MG tablet Take 1,000 mg by mouth daily.   Calcium-Vitamin D  (CALTRATE 600 PLUS-VIT D PO) Take 1 tablet by mouth 2 (two) times daily.    cholecalciferol (VITAMIN D ) 1000 UNITS tablet Take 1,000 Units by mouth 2 (two) times daily.   Multiple Vitamins-Minerals (HAIR SKIN NAILS) CAPS Take 3,000 mg by mouth.   Multiple Vitamins-Minerals (PRESERVISION AREDS) TABS Take 1 tablet by mouth 2 (two) times daily.   Tetrahydrozoline HCl (VISINE OP) Place 1 drop into both eyes at bedtime.   [DISCONTINUED] alendronate  (FOSAMAX ) 70 MG tablet Take 1 tablet (70 mg total) by mouth once a week. Take with full glass of water on an empty stomach   [DISCONTINUED] amLODipine  (NORVASC ) 5 MG tablet Take 1 tablet (5 mg total) by mouth daily.   [DISCONTINUED] lisinopril  (ZESTRIL ) 10 MG tablet Take 1 tablet (10 mg total) by mouth every evening.   No facility-administered medications prior to visit.    ROS All review of systems negative except what is listed in the HPI        Objective:     BP (!) 147/61   Pulse 93   Ht 4' 10 (1.473 m)   Wt 98 lb (44.5 kg)   SpO2 99%   BMI 20.48 kg/m    Physical Exam Vitals reviewed.  Constitutional:      General: She is not in acute distress.    Appearance: Normal appearance.  She is not ill-appearing.  HENT:     Head: Normocephalic and atraumatic.     Right Ear: Tympanic membrane normal.     Left Ear: Tympanic membrane normal.     Nose: Nose normal.     Mouth/Throat:     Mouth: Mucous membranes are moist.     Pharynx: Oropharynx is clear.  Eyes:     Extraocular Movements: Extraocular movements intact.     Conjunctiva/sclera: Conjunctivae normal.     Pupils: Pupils are equal, round, and reactive to light.  Cardiovascular:     Rate and Rhythm: Normal rate and regular rhythm.     Pulses: Normal pulses.     Heart sounds: Normal heart sounds.  Pulmonary:     Effort: Pulmonary effort is normal.     Breath sounds: Normal breath sounds.  Abdominal:     General: Abdomen is flat. Bowel sounds are normal. There is no distension.     Palpations: Abdomen is soft. There is no mass.     Tenderness:  There is no abdominal tenderness. There is no right CVA tenderness, left CVA tenderness, guarding or rebound.  Genitourinary:    Comments: Deferred exam Musculoskeletal:        General: Normal range of motion.     Cervical back: Normal range of motion and neck supple. No tenderness.     Right lower leg: No edema.     Left lower leg: No edema.  Lymphadenopathy:     Cervical: No cervical adenopathy.  Skin:    General: Skin is warm and dry.     Capillary Refill: Capillary refill takes less than 2 seconds.  Neurological:     General: No focal deficit present.     Mental Status: She is alert and oriented to person, place, and time. Mental status is at baseline.  Psychiatric:        Mood and Affect: Mood normal.        Behavior: Behavior normal.        Thought Content: Thought content normal.        Judgment: Judgment normal.           Results for orders placed or performed in visit on 05/21/24  CBC with Differential/Platelet  Result Value Ref Range   WBC 5.7 4.0 - 10.5 K/uL   RBC 4.73 3.87 - 5.11 Mil/uL   Hemoglobin 15.1 (H) 12.0 - 15.0 g/dL   HCT 54.5 63.9 - 53.9 %   MCV 96.0 78.0 - 100.0 fl   MCHC 33.3 30.0 - 36.0 g/dL   RDW 83.7 (H) 88.4 - 84.4 %   Platelets 220.0 150.0 - 400.0 K/uL   Neutrophils Relative % 64.9 43.0 - 77.0 %   Lymphocytes Relative 23.3 12.0 - 46.0 %   Monocytes Relative 9.7 3.0 - 12.0 %   Eosinophils Relative 1.1 0.0 - 5.0 %   Basophils Relative 1.0 0.0 - 3.0 %   Neutro Abs 3.7 1.4 - 7.7 K/uL   Lymphs Abs 1.3 0.7 - 4.0 K/uL   Monocytes Absolute 0.5 0.1 - 1.0 K/uL   Eosinophils Absolute 0.1 0.0 - 0.7 K/uL   Basophils Absolute 0.1 0.0 - 0.1 K/uL  Lipid panel  Result Value Ref Range   Cholesterol 243 (H) 0 - 200 mg/dL   Triglycerides 877.9 0.0 - 149.0 mg/dL   HDL 00.59 >60.99 mg/dL   VLDL 75.5 0.0 - 59.9 mg/dL   LDL Cholesterol 880 (H) 0 - 99 mg/dL   Total CHOL/HDL  Ratio 2    NonHDL 143.28   TSH  Result Value Ref Range   TSH 1.21 0.35 - 5.50  uIU/mL  Comprehensive metabolic panel with GFR  Result Value Ref Range   Sodium 139 135 - 145 mEq/L   Potassium 4.2 3.5 - 5.1 mEq/L   Chloride 103 96 - 112 mEq/L   CO2 25 19 - 32 mEq/L   Glucose, Bld 107 (H) 70 - 99 mg/dL   BUN 21 6 - 23 mg/dL   Creatinine, Ser 9.33 0.40 - 1.20 mg/dL   Total Bilirubin 0.9 0.2 - 1.2 mg/dL   Alkaline Phosphatase 78 39 - 117 U/L   AST 23 0 - 37 U/L   ALT 17 0 - 35 U/L   Total Protein 7.3 6.0 - 8.3 g/dL   Albumin  4.6 3.5 - 5.2 g/dL   GFR 24.64 >39.99 mL/min   Calcium 9.6 8.4 - 10.5 mg/dL       Assessment & Plan:    Routine Health Maintenance and Physical Exam Discussed health promotion and safety including diet and exercise recommendations, dental health, and injury prevention. Tobacco cessation if applicable. Seat belts, sunscreen, smoke detectors, etc.    Immunization History  Administered Date(s) Administered   Moderna Covid-19 Fall Seasonal Vaccine 64yrs & older 03/15/2023, 09/12/2023   Moderna SARS-COV2 Booster Vaccination 04/07/2020   Moderna Sars-Covid-2 Vaccination 06/09/2019, 07/08/2019   Pneumococcal-Unspecified 01/07/2013    Health Maintenance  Topic Date Due   Zoster Vaccines- Shingrix (1 of 2) 08/17/2024 (Originally 06/15/1980)   Influenza Vaccine  09/09/2024 (Originally 01/11/2024)   COVID-19 Vaccine (6 - 2025-26 season) 05/19/2025 (Originally 02/11/2024)   Bone Density Scan  Completed   Meningococcal B Vaccine  Aged Out   DTaP/Tdap/Td  Discontinued   Pneumococcal Vaccine: 50+ Years  Discontinued        Problem List Items Addressed This Visit       Active Problems   Primary hypertension (Chronic)   Blood pressure is not at goal for age and co-morbidities, but home readings are excellent; consider white coat HTN component. Recommendations: continue lisinopril  10 mg daily and amlodipine  5 mg daily  - BP goal <130/80 - monitor and log blood pressures at home - check around the same time each day in a relaxed setting - Limit  salt to <2000 mg/day - Follow DASH eating plan (heart healthy diet) - limit alcohol to 2 standard drinks per day for men and 1 per day for women - avoid tobacco products - get at least 2 hours of regular aerobic exercise weekly Patient aware of signs/symptoms requiring further/urgent evaluation. Labs today       Relevant Medications   lisinopril  (ZESTRIL ) 10 MG tablet   amLODipine  (NORVASC ) 5 MG tablet   Other Relevant Orders   Lipid panel (Completed)   TSH (Completed)   Comprehensive metabolic panel with GFR (Completed)   Mixed hyperlipidemia (Chronic)   -Medication management: continue lifestyle measures -Regular exercise - at least 30 minutes, 5 times per week Lifestyle factors for lowering cholesterol include: Diet therapy - heart-healthy diet rich in fruits, veggies, fiber-rich whole grains, lean meats, chicken, fish (at least twice a week), fat-free or 1% dairy products; foods low in saturated/trans fats, cholesterol, sodium, and sugar. Mediterranean diet has shown to be very heart healthy. Regular exercise - recommend at least 30 minutes a day, 5 times per week Weight management        Relevant Medications   lisinopril  (ZESTRIL ) 10 MG tablet   amLODipine  (  NORVASC ) 5 MG tablet   Other Relevant Orders   Lipid panel (Completed)   Comprehensive metabolic panel with GFR (Completed)   Osteoporosis without current pathological fracture (Chronic)   Relevant Medications   alendronate  (FOSAMAX ) 70 MG tablet   Other Visit Diagnoses       Annual physical exam    -  Primary   Relevant Orders   CBC with Differential/Platelet (Completed)   Lipid panel (Completed)   TSH (Completed)   Comprehensive metabolic panel with GFR (Completed)          PATIENT COUNSELING:    Recommend that most people either abstain from alcohol or drink within safe limits (<=14/week and <=4 drinks/occasion for males, <=7/weeks and <= 3 drinks/occasion for females) and that the risk for alcohol  disorders and other health effects rises proportionally with the number of drinks per week and how often a drinker exceeds daily limits.   Diet: Recommend to adjust caloric intake to maintain or achieve ideal body weight, to reduce intake of dietary saturated fat and total fat, to limit sodium intake by avoiding high sodium foods and not adding table salt, and to maintain adequate dietary potassium and calcium preferably from fresh fruits, vegetables, and low-fat dairy products.   Emphasized the importance of regular exercise.  Injury prevention: Recommend seatbelts, safety helmets, smoke detector, etc..   Dental health: Recommend regular tooth brushing, flossing, and dental visits.       Return in about 6 months (around 11/19/2024) for chronic disease management.     Waddell KATHEE Mon, NP  I,Emily Lagle,acting as a scribe for Waddell KATHEE Mon, NP.,have documented all relevant documentation on the behalf of Waddell KATHEE Mon, NP.  I, Waddell KATHEE Mon, NP, have reviewed all documentation for this visit. The documentation on 05/21/2024 for the exam, diagnosis, procedures, and orders are all accurate and complete.

## 2024-05-22 ENCOUNTER — Ambulatory Visit: Payer: Self-pay | Admitting: Family Medicine

## 2024-06-02 ENCOUNTER — Other Ambulatory Visit: Payer: Self-pay | Admitting: Family Medicine

## 2024-06-02 DIAGNOSIS — I10 Essential (primary) hypertension: Secondary | ICD-10-CM
